# Patient Record
Sex: Female | Born: 1970
Health system: Southern US, Community
[De-identification: ages and names within clinical notes are randomized; demographics above are authoritative.]

## PROBLEM LIST (undated history)

## (undated) DIAGNOSIS — E7800 Pure hypercholesterolemia, unspecified: Secondary | ICD-10-CM

## (undated) DIAGNOSIS — Z8619 Personal history of other infectious and parasitic diseases: Secondary | ICD-10-CM

## (undated) DIAGNOSIS — O09529 Supervision of elderly multigravida, unspecified trimester: Secondary | ICD-10-CM

## (undated) DIAGNOSIS — I10 Essential (primary) hypertension: Secondary | ICD-10-CM

## (undated) HISTORY — DX: Personal history of other infectious and parasitic diseases: Z86.19

## (undated) HISTORY — DX: Supervision of elderly multigravida, unspecified trimester: O09.529

## (undated) HISTORY — DX: Pure hypercholesterolemia, unspecified: E78.00

## (undated) HISTORY — DX: Essential (primary) hypertension: I10

---

## 1997-01-30 HISTORY — PX: REDUCTION MAMMAPLASTY: SUR839

## 1997-01-30 HISTORY — PX: BREAST SURGERY: SHX581

## 1998-01-14 ENCOUNTER — Other Ambulatory Visit: Admission: RE | Admit: 1998-01-14 | Discharge: 1998-01-14 | Payer: Self-pay | Admitting: Obstetrics

## 1998-01-14 ENCOUNTER — Ambulatory Visit (HOSPITAL_COMMUNITY): Admission: RE | Admit: 1998-01-14 | Discharge: 1998-01-14 | Payer: Self-pay | Admitting: Obstetrics

## 1998-07-01 ENCOUNTER — Inpatient Hospital Stay: Admission: AD | Admit: 1998-07-01 | Discharge: 1998-07-01 | Payer: Self-pay | Admitting: Obstetrics

## 1998-08-26 ENCOUNTER — Inpatient Hospital Stay (HOSPITAL_COMMUNITY): Admission: AD | Admit: 1998-08-26 | Discharge: 1998-08-28 | Payer: Self-pay | Admitting: Obstetrics

## 1999-01-05 ENCOUNTER — Other Ambulatory Visit: Admission: RE | Admit: 1999-01-05 | Discharge: 1999-01-05 | Payer: Self-pay | Admitting: Obstetrics

## 2001-11-08 ENCOUNTER — Other Ambulatory Visit: Admission: RE | Admit: 2001-11-08 | Discharge: 2001-11-08 | Payer: Self-pay | Admitting: Family Medicine

## 2003-01-12 ENCOUNTER — Other Ambulatory Visit: Admission: RE | Admit: 2003-01-12 | Discharge: 2003-01-12 | Payer: Self-pay | Admitting: Family Medicine

## 2004-01-19 ENCOUNTER — Other Ambulatory Visit: Admission: RE | Admit: 2004-01-19 | Discharge: 2004-01-19 | Payer: Self-pay | Admitting: Family Medicine

## 2005-01-19 ENCOUNTER — Other Ambulatory Visit: Admission: RE | Admit: 2005-01-19 | Discharge: 2005-01-19 | Payer: Self-pay | Admitting: Obstetrics and Gynecology

## 2006-01-22 ENCOUNTER — Other Ambulatory Visit: Admission: RE | Admit: 2006-01-22 | Discharge: 2006-01-22 | Payer: Self-pay | Admitting: Family Medicine

## 2006-03-15 ENCOUNTER — Ambulatory Visit (HOSPITAL_BASED_OUTPATIENT_CLINIC_OR_DEPARTMENT_OTHER): Admission: RE | Admit: 2006-03-15 | Discharge: 2006-03-15 | Payer: Self-pay | Admitting: Urology

## 2006-03-19 ENCOUNTER — Ambulatory Visit (HOSPITAL_COMMUNITY): Admission: RE | Admit: 2006-03-19 | Discharge: 2006-03-19 | Payer: Self-pay | Admitting: Urology

## 2007-03-05 ENCOUNTER — Other Ambulatory Visit: Admission: RE | Admit: 2007-03-05 | Discharge: 2007-03-05 | Payer: Self-pay | Admitting: Family Medicine

## 2008-05-18 ENCOUNTER — Other Ambulatory Visit: Admission: RE | Admit: 2008-05-18 | Discharge: 2008-05-18 | Payer: Self-pay | Admitting: Family Medicine

## 2009-07-05 ENCOUNTER — Other Ambulatory Visit: Admission: RE | Admit: 2009-07-05 | Discharge: 2009-07-05 | Payer: Self-pay | Admitting: Family Medicine

## 2010-02-20 ENCOUNTER — Encounter: Payer: Self-pay | Admitting: Family Medicine

## 2010-06-17 NOTE — Op Note (Signed)
NAME:  Penny Ramirez, Penny Ramirez NO.:  [id number]   MEDICAL RECORD NO.:  [id number]          PATIENT TYPE:  AMB   LOCATION:  [facility]                         FACILITY:  [facility]   PHYSICIAN:  Excell Seltzer. Annabell Howells, M.D.    DATE OF BIRTH:  [birth year]   DATE OF PROCEDURE:  03/15/2006  DATE OF DISCHARGE:                               OPERATIVE REPORT   PROCEDURE:  Cystoscopy, left retrograde pyelogram with interpretation,  insertion of left double-J stent.   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   SURGEON:  Dr. Bjorn Pippin.   ANESTHESIA:  General.   DRAINS:  6-French x 24 cm double-J stent.   COMPLICATIONS:  None.   INDICATIONS:  Ms. Lizaola is a 40 year old African American female who  presented on January 29 with history of recurrent voiding difficulties.  She was found to have urinary tract infection but also obstructed left  kidney with stones.  A follow-up CT scan demonstrated a small left UPJ  stone with multiple lower pole and mid renal stones.  It was felt that  cysto and stenting was indicated followed by lithotripsy at a later  date.  She is currently on Macrodantin for suppression.   FINDINGS AND PROCEDURE:  The patient is taken operating room where  general anesthetic was induced.  She had received Cipro preoperatively.  She was placed in lithotomy position.  Her perineum and genitalia were  prepped Betadine solution.  She was draped in the usual sterile fashion.  The preoperative KUB revealed a calcification in the area of the left  UPJ with additional stones in the left mid and lower poles   In the operating room the patient was prepped with Betadine solution and  draped in the usual sterile fashions.  Cysto was performed in usual  fashion with the 16-French  flexible scope with a 12 and 70 degrees  lenses.  Examination revealed a normal urethra.  The bladder wall had  mild trabeculation.  No tumor, stones or inflammation were noted.  Ureteral orifices were unremarkable.   The left ureteral orifice was cannulated with a 5-French open end  catheter and contrast was instilled in retrograde fashion.   Retrograde pyelogram revealed a normal ureter up to the level of the UPJ  where there was some narrowing but I would not consider it a UPJ  obstruction.  I think the stone actually retropulsed to the kidney with  the contrast.  There was moderate hydro caliectasis with filling defects  in the mid lower pole consistent with a stone seen on KUB.   After completion of retrograde pyelogram a guidewire was passed to the  kidney and a 6-French 24 cm double-J stent was passed to the kidney  without difficulty under fluoroscopic guidance.  The wire was removed  leaving good coil in the kidney, good coil in the bladder.  The bladder  was drained.  The cystoscope was removed.  The patient was taken down  from lithotomy position.  Her anesthetic was reversed.  She was moved to  the recovery room in stable condition.  There were no complications.  She will have lithotripsy done sometime next week.      Excell Seltzer. Annabell Howells, M.D.  Electronically Signed     JJW/MEDQ  D:  03/15/2006  T:  03/15/2006  Job:  [id number]

## 2010-09-06 ENCOUNTER — Other Ambulatory Visit (HOSPITAL_COMMUNITY)
Admission: RE | Admit: 2010-09-06 | Discharge: 2010-09-06 | Disposition: A | Discharge status: Home / Self Care | Payer: 59 | Source: Ambulatory Visit | Attending: Family Medicine | Admitting: Family Medicine

## 2010-09-06 ENCOUNTER — Other Ambulatory Visit: Payer: Self-pay | Admitting: Family Medicine

## 2010-09-06 DIAGNOSIS — Z124 Encounter for screening for malignant neoplasm of cervix: Secondary | ICD-10-CM | POA: Insufficient documentation

## 2011-05-12 LAB — OB RESULTS CONSOLE ANTIBODY SCREEN: Antibody Screen: NEGATIVE

## 2011-05-12 LAB — OB RESULTS CONSOLE HEPATITIS B SURFACE ANTIGEN: Hepatitis B Surface Ag: NEGATIVE

## 2011-05-12 LAB — OB RESULTS CONSOLE ABO/RH

## 2011-05-26 ENCOUNTER — Other Ambulatory Visit (HOSPITAL_COMMUNITY): Payer: Self-pay | Admitting: Obstetrics and Gynecology

## 2011-05-26 ENCOUNTER — Ambulatory Visit (HOSPITAL_COMMUNITY)
Admission: RE | Admit: 2011-05-26 | Discharge: 2011-05-26 | Disposition: A | Discharge status: Home / Self Care | Payer: 59 | Source: Ambulatory Visit | Attending: Obstetrics and Gynecology | Admitting: Obstetrics and Gynecology

## 2011-05-26 DIAGNOSIS — O3680X0 Pregnancy with inconclusive fetal viability, not applicable or unspecified: Secondary | ICD-10-CM | POA: Insufficient documentation

## 2011-05-26 DIAGNOSIS — Z3689 Encounter for other specified antenatal screening: Secondary | ICD-10-CM | POA: Insufficient documentation

## 2011-05-26 LAB — OB RESULTS CONSOLE GC/CHLAMYDIA: Gonorrhea: NEGATIVE

## 2011-11-02 ENCOUNTER — Other Ambulatory Visit (HOSPITAL_COMMUNITY): Payer: Self-pay | Admitting: Obstetrics and Gynecology

## 2011-11-02 ENCOUNTER — Encounter (HOSPITAL_COMMUNITY): Payer: Self-pay

## 2011-11-02 ENCOUNTER — Ambulatory Visit (HOSPITAL_COMMUNITY)
Admission: RE | Admit: 2011-11-02 | Discharge: 2011-11-02 | Disposition: A | Discharge status: Home / Self Care | Payer: 59 | Source: Ambulatory Visit | Attending: Obstetrics and Gynecology | Admitting: Obstetrics and Gynecology

## 2011-11-02 DIAGNOSIS — Z3689 Encounter for other specified antenatal screening: Secondary | ICD-10-CM

## 2011-11-02 DIAGNOSIS — O368390 Maternal care for abnormalities of the fetal heart rate or rhythm, unspecified trimester, not applicable or unspecified: Secondary | ICD-10-CM | POA: Insufficient documentation

## 2011-11-02 DIAGNOSIS — O10019 Pre-existing essential hypertension complicating pregnancy, unspecified trimester: Secondary | ICD-10-CM | POA: Insufficient documentation

## 2011-11-28 ENCOUNTER — Other Ambulatory Visit (HOSPITAL_COMMUNITY): Payer: Self-pay | Admitting: Obstetrics and Gynecology

## 2011-11-28 DIAGNOSIS — I10 Essential (primary) hypertension: Secondary | ICD-10-CM

## 2011-12-05 ENCOUNTER — Ambulatory Visit (HOSPITAL_COMMUNITY)
Admission: RE | Admit: 2011-12-05 | Discharge: 2011-12-05 | Disposition: A | Discharge status: Home / Self Care | Payer: 59 | Source: Ambulatory Visit | Attending: Obstetrics and Gynecology | Admitting: Obstetrics and Gynecology

## 2011-12-05 DIAGNOSIS — O10019 Pre-existing essential hypertension complicating pregnancy, unspecified trimester: Secondary | ICD-10-CM | POA: Insufficient documentation

## 2011-12-05 DIAGNOSIS — I10 Essential (primary) hypertension: Secondary | ICD-10-CM

## 2011-12-21 ENCOUNTER — Encounter (HOSPITAL_COMMUNITY): Payer: Self-pay | Admitting: *Deleted

## 2011-12-21 ENCOUNTER — Telehealth (HOSPITAL_COMMUNITY): Payer: Self-pay | Admitting: *Deleted

## 2011-12-21 NOTE — Telephone Encounter (Signed)
Preadmission screen  

## 2011-12-26 ENCOUNTER — Encounter (HOSPITAL_COMMUNITY): Payer: Self-pay

## 2011-12-26 ENCOUNTER — Other Ambulatory Visit: Payer: Self-pay | Admitting: Obstetrics and Gynecology

## 2011-12-26 ENCOUNTER — Inpatient Hospital Stay (HOSPITAL_COMMUNITY)
Admission: RE | Admit: 2011-12-26 | Discharge: 2011-12-29 | DRG: 765 | Disposition: A | Discharge status: Home / Self Care | Payer: 59 | Source: Ambulatory Visit | Attending: Obstetrics and Gynecology | Admitting: Obstetrics and Gynecology

## 2011-12-26 VITALS — BP 131/85 | HR 84 | Temp 98.0°F | Resp 16 | Ht 64.5 in | Wt 240.0 lb

## 2011-12-26 DIAGNOSIS — O1002 Pre-existing essential hypertension complicating childbirth: Secondary | ICD-10-CM | POA: Diagnosis present

## 2011-12-26 DIAGNOSIS — Z98891 History of uterine scar from previous surgery: Secondary | ICD-10-CM

## 2011-12-26 DIAGNOSIS — Z22330 Carrier of Group B streptococcus: Secondary | ICD-10-CM

## 2011-12-26 DIAGNOSIS — O99892 Other specified diseases and conditions complicating childbirth: Secondary | ICD-10-CM | POA: Diagnosis present

## 2011-12-26 DIAGNOSIS — O09529 Supervision of elderly multigravida, unspecified trimester: Secondary | ICD-10-CM | POA: Diagnosis present

## 2011-12-26 LAB — CBC
Hemoglobin: 12 g/dL (ref 12.0–15.0)
MCH: 29.9 pg (ref 26.0–34.0)
RBC: 4.01 MIL/uL (ref 3.87–5.11)
WBC: 9.1 10*3/uL (ref 4.0–10.5)

## 2011-12-26 LAB — TYPE AND SCREEN: Antibody Screen: NEGATIVE

## 2011-12-26 LAB — ABO/RH: ABO/RH(D): A POS

## 2011-12-26 LAB — RPR: RPR Ser Ql: NONREACTIVE

## 2011-12-26 MED ORDER — LACTATED RINGERS IV SOLN
500.0000 mL | INTRAVENOUS | Status: DC | PRN
  Administered 2011-12-26: 1000 mL via INTRAVENOUS
  Administered 2011-12-26 – 2011-12-27 (×2): 500 mL via INTRAVENOUS

## 2011-12-26 MED ORDER — FLEET ENEMA 7-19 GM/118ML RE ENEM: 1.0000 | ENEMA | RECTAL | Status: DC | PRN

## 2011-12-26 MED ORDER — CITRIC ACID-SODIUM CITRATE 334-500 MG/5ML PO SOLN
30.0000 mL | ORAL | Status: DC | PRN
  Administered 2011-12-27: 30 mL via ORAL
  Filled 2011-12-26: qty 15

## 2011-12-26 MED ORDER — TERBUTALINE SULFATE 1 MG/ML IJ SOLN: 0.2500 mg | Freq: Once | INTRAMUSCULAR | Status: AC | PRN

## 2011-12-26 MED ORDER — OXYCODONE-ACETAMINOPHEN 5-325 MG PO TABS: 1.0000 | ORAL_TABLET | ORAL | Status: DC | PRN

## 2011-12-26 MED ORDER — OXYTOCIN 40 UNITS IN LACTATED RINGERS INFUSION - SIMPLE MED: 62.5000 mL/h | INTRAVENOUS | Status: DC

## 2011-12-26 MED ORDER — LACTATED RINGERS IV SOLN
INTRAVENOUS | Status: DC
  Administered 2011-12-26 – 2011-12-27 (×3): via INTRAVENOUS

## 2011-12-26 MED ORDER — IBUPROFEN 600 MG PO TABS: 600.0000 mg | ORAL_TABLET | Freq: Four times a day (QID) | ORAL | Status: DC | PRN

## 2011-12-26 MED ORDER — ONDANSETRON HCL 4 MG/2ML IJ SOLN: 4.0000 mg | Freq: Four times a day (QID) | INTRAMUSCULAR | Status: DC | PRN

## 2011-12-26 MED ORDER — ACETAMINOPHEN 325 MG PO TABS: 650.0000 mg | ORAL_TABLET | ORAL | Status: DC | PRN

## 2011-12-26 MED ORDER — OXYTOCIN 40 UNITS IN LACTATED RINGERS INFUSION - SIMPLE MED
1.0000 m[IU]/min | INTRAVENOUS | Status: DC
  Administered 2011-12-26: 1 m[IU]/min via INTRAVENOUS
  Administered 2011-12-27: 5 m[IU]/min via INTRAVENOUS
  Filled 2011-12-26: qty 1000

## 2011-12-26 MED ORDER — MISOPROSTOL 25 MCG QUARTER TABLET
25.0000 ug | ORAL_TABLET | ORAL | Status: DC | PRN
  Administered 2011-12-26: 25 ug via VAGINAL
  Filled 2011-12-26: qty 0.25

## 2011-12-26 MED ORDER — PENICILLIN G POTASSIUM 5000000 UNITS IJ SOLR
2.5000 10*6.[IU] | INTRAMUSCULAR | Status: DC
  Filled 2011-12-26 (×7): qty 2.5

## 2011-12-26 MED ORDER — LIDOCAINE HCL (PF) 1 % IJ SOLN: 30.0000 mL | INTRAMUSCULAR | Status: DC | PRN

## 2011-12-26 MED ORDER — OXYTOCIN BOLUS FROM INFUSION: 500.0000 mL | INTRAVENOUS | Status: DC

## 2011-12-26 MED ORDER — PENICILLIN G POTASSIUM 5000000 UNITS IJ SOLR
5.0000 10*6.[IU] | Freq: Once | INTRAVENOUS | Status: DC
  Filled 2011-12-26: qty 5

## 2011-12-26 MED ORDER — LABETALOL HCL 100 MG PO TABS
100.0000 mg | ORAL_TABLET | Freq: Two times a day (BID) | ORAL | Status: DC
  Administered 2011-12-26: 100 mg via ORAL
  Filled 2011-12-26 (×2): qty 1

## 2011-12-26 NOTE — Progress Notes (Signed)
Penny Ramirez is a 41 y.o. G3P2002 at [redacted]w[redacted]d by LMP admitted for induction of labor due to Post dates. Due date 12/26/2011 and chronic hypertension Pt request social induction .  Subjective:   Objective: BP 115/72  Pulse 80  Temp 98.4 F (36.9 C) (Oral)  Resp 20  Ht 5' 4.5" (1.638 m)  Wt 108.863 kg (240 lb)  BMI 40.56 kg/m2  LMP 03/21/2011      FHT:  FHR baseline 150 with moderate variability + accelerations .[name] Decelerations have resolved  UC:   irregular, every 3-5 minutes SVE:   Dilation: Closed Station: -3 Exam by:: Dr. Richardson Dopp  Labs: Lab Results  Component Value Date   WBC 9.1 12/26/2011   HGB 12.0 12/26/2011   HCT 35.6* 12/26/2011   MCV 88.8 12/26/2011   PLT 226 12/26/2011    Assessment / Plan: 40 wks 0 days for induction  Due to chronic hypertension ... And social request   Labor: cytotec in place  Preeclampsia:  no signs or symptoms of toxicity Fetal Wellbeing:  Category I Pain Control:  Labor support without medications I/D:  planning penicillin in active labor or with ROM for + Gbs status    Melani Brisbane J. 12/26/2011, 2:36 PM

## 2011-12-26 NOTE — H&P (Signed)
Penny Ramirez is a 41 y.o. female presenting for  Induction at 40 wks 0 days. Based on LMP confirmed by u/s with EDD 12/26/2011. Pregnancy complicated by chornic hypertension. Pt denies contractions no lof no vaginal bleeding + FM  History OB History    Grav Para Term Preterm Abortions TAB SAB Ect Mult Living   3 2 2  0 0 0 0 0 0 2     Past Medical History  Diagnosis Date  . Hx of varicella   . AMA (advanced maternal age) multigravida 35+   . Hypertension     labetolol   Past Surgical History  Procedure Date  . Breast surgery 1999    reduction   Family History: family history includes Diabetes in her father and paternal uncle; Heart disease in her father; and Hypertension in her father. Social History:  reports that she has never smoked. She has never used smokeless tobacco. She reports that she does not drink alcohol or use illicit drugs.   Prenatal Transfer Tool  Maternal Diabetes: No Genetic Screening: Normal Maternal Ultrasounds/Referrals: Normal Fetal Ultrasounds or other Referrals:  None Maternal Substance Abuse:  No Significant Maternal Medications:  Meds include: Other:  Significant Maternal Lab Results:  None GBS positive  Other Comments:  None  ROS  Dilation: Closed Station: -3 Exam by:: Dr. Richardson Dopp Blood pressure 115/72, pulse 80, temperature 98.4 F (36.9 C), temperature source Oral, resp. rate 20, height 5' 4.5" (1.638 m), weight 108.863 kg (240 lb), last menstrual period 03/21/2011. Maternal Exam:  Abdomen: Fetal presentation: vertex  Introitus: Normal vulva. Normal vagina.  Pelvis: adequate for delivery.   Cervix: Cervix evaluated by digital exam.     Fetal Exam Fetal Monitor Review: Mode: fetoscope.   Baseline rate: 150.  Variability: moderate (6-25 bpm).   Pattern: variable decelerations and late decelerations.    Fetal State Assessment: Category II - tracings are indeterminate.     Physical Exam  Constitutional: She is oriented to person,  place, and time. She appears well-developed and well-nourished.  Cardiovascular: Normal rate.   GI: There is no tenderness.  Musculoskeletal: Normal range of motion.  Neurological: She is alert and oriented to person, place, and time.  Skin: Skin is warm and dry.  Psychiatric: She has a normal mood and affect.    Prenatal labs: ABO, Rh: A/Positive/-- (04/12 0000) Antibody: Negative (04/12 0000) Rubella: Immune (04/12 0000) RPR: Nonreactive (04/12 0000)  HBsAg: Negative (04/12 0000)  HIV: Non-reactive (04/12 0000)  GBS: Positive (10/29 0000)   Assessment/Plan: 40 wks and 0 days with chronic hypertension for induction. Pt was advised of increased risk of cesarean section associated with induction. She voiced understanding and desires to have induction scheduled.   pt with variable and late decels currently ... If they do not resolve with repositioning and fluid bolus suggest cesarean section. R/b/a of cesarean section were discussed with the patient including but not limited to infection bleeding damage to bowel bladder baby with the need for further surgery. R/o transfusion HIV/ hep B&C discussed pt voiced understanding  Loui Massenburg J. 12/26/2011, 2:22 PM

## 2011-12-26 NOTE — Progress Notes (Signed)
Called Dr. Richardson Dopp to notify of 2 late decelerations and min-mod variability and accels with recovery without interventions. MD aware. Orders to leave Pitocin at 5mu and call if decels become repetative.

## 2011-12-26 NOTE — Progress Notes (Signed)
Spoke with Dr. Richardson Dopp, orders received for labetalol 100mg  PO BID first dose tonight.  Reported FHR min-mod variability with occasional Accels. Recent BP's reported to MD. No new orders received.

## 2011-12-27 ENCOUNTER — Encounter (HOSPITAL_COMMUNITY): Payer: Self-pay

## 2011-12-27 ENCOUNTER — Encounter (HOSPITAL_COMMUNITY): Payer: Self-pay | Admitting: Anesthesiology

## 2011-12-27 ENCOUNTER — Observation Stay (HOSPITAL_COMMUNITY): Payer: 59 | Admitting: Anesthesiology

## 2011-12-27 ENCOUNTER — Encounter (HOSPITAL_COMMUNITY)
Admission: RE | Disposition: A | Discharge status: Home / Self Care | Payer: Self-pay | Source: Ambulatory Visit | Attending: Obstetrics and Gynecology

## 2011-12-27 LAB — CBC
MCV: 89.3 fL (ref 78.0–100.0)
Platelets: 213 10*3/uL (ref 150–400)
RDW: 14.3 % (ref 11.5–15.5)
WBC: 11.3 10*3/uL — ABNORMAL HIGH (ref 4.0–10.5)

## 2011-12-27 SURGERY — Surgical Case
Anesthesia: Epidural | Site: Abdomen | Wound class: Clean Contaminated

## 2011-12-27 MED ORDER — ZOLPIDEM TARTRATE 5 MG PO TABS: 5.0000 mg | ORAL_TABLET | Freq: Every evening | ORAL | Status: DC | PRN

## 2011-12-27 MED ORDER — SCOPOLAMINE 1 MG/3DAYS TD PT72
1.0000 | MEDICATED_PATCH | Freq: Once | TRANSDERMAL | Status: DC
  Administered 2011-12-27: 1.5 mg via TRANSDERMAL

## 2011-12-27 MED ORDER — LACTATED RINGERS IV SOLN
INTRAVENOUS | Status: DC
  Administered 2011-12-27: 16:00:00 via INTRAVENOUS

## 2011-12-27 MED ORDER — FERROUS SULFATE 325 (65 FE) MG PO TABS: 325.0000 mg | ORAL_TABLET | Freq: Two times a day (BID) | ORAL | Status: DC

## 2011-12-27 MED ORDER — ONDANSETRON HCL 4 MG/2ML IJ SOLN: 4.0000 mg | Freq: Three times a day (TID) | INTRAMUSCULAR | Status: DC | PRN

## 2011-12-27 MED ORDER — METOCLOPRAMIDE HCL 5 MG/ML IJ SOLN: 10.0000 mg | Freq: Three times a day (TID) | INTRAMUSCULAR | Status: DC | PRN

## 2011-12-27 MED ORDER — FENTANYL CITRATE 0.05 MG/ML IJ SOLN: 25.0000 ug | INTRAMUSCULAR | Status: DC | PRN

## 2011-12-27 MED ORDER — SCOPOLAMINE 1 MG/3DAYS TD PT72
MEDICATED_PATCH | TRANSDERMAL | Status: AC
  Filled 2011-12-27: qty 1

## 2011-12-27 MED ORDER — OXYTOCIN 40 UNITS IN LACTATED RINGERS INFUSION - SIMPLE MED
INTRAVENOUS | Status: DC | PRN
  Administered 2011-12-27: 40 [IU] via INTRAVENOUS

## 2011-12-27 MED ORDER — DEXTROSE 5 % IV SOLN
1.0000 ug/kg/h | INTRAVENOUS | Status: DC | PRN
  Filled 2011-12-27: qty 2

## 2011-12-27 MED ORDER — LACTATED RINGERS IV SOLN
INTRAVENOUS | Status: DC | PRN
  Administered 2011-12-27 (×3): via INTRAVENOUS

## 2011-12-27 MED ORDER — OXYCODONE-ACETAMINOPHEN 5-325 MG PO TABS: 1.0000 | ORAL_TABLET | ORAL | Status: DC | PRN

## 2011-12-27 MED ORDER — SIMETHICONE 80 MG PO CHEW
80.0000 mg | CHEWABLE_TABLET | Freq: Three times a day (TID) | ORAL | Status: DC
  Administered 2011-12-27 – 2011-12-29 (×6): 80 mg via ORAL

## 2011-12-27 MED ORDER — DIPHENHYDRAMINE HCL 50 MG/ML IJ SOLN
12.5000 mg | INTRAMUSCULAR | Status: DC | PRN
  Administered 2011-12-27: 12.5 mg via INTRAVENOUS
  Filled 2011-12-27: qty 1

## 2011-12-27 MED ORDER — IBUPROFEN 600 MG PO TABS: 600.0000 mg | ORAL_TABLET | Freq: Four times a day (QID) | ORAL | Status: DC | PRN

## 2011-12-27 MED ORDER — IBUPROFEN 600 MG PO TABS
600.0000 mg | ORAL_TABLET | Freq: Four times a day (QID) | ORAL | Status: DC
  Administered 2011-12-27 – 2011-12-29 (×7): 600 mg via ORAL
  Filled 2011-12-27 (×7): qty 1

## 2011-12-27 MED ORDER — PRENATAL MULTIVITAMIN CH
1.0000 | ORAL_TABLET | Freq: Every day | ORAL | Status: DC
  Administered 2011-12-28 – 2011-12-29 (×2): 1 via ORAL
  Filled 2011-12-27: qty 1

## 2011-12-27 MED ORDER — ONDANSETRON HCL 4 MG/2ML IJ SOLN: 4.0000 mg | INTRAMUSCULAR | Status: DC | PRN

## 2011-12-27 MED ORDER — METOCLOPRAMIDE HCL 5 MG/ML IJ SOLN: 10.0000 mg | Freq: Once | INTRAMUSCULAR | Status: DC | PRN

## 2011-12-27 MED ORDER — LACTATED RINGERS IV SOLN
INTRAVENOUS | Status: DC | PRN
  Administered 2011-12-27: 08:00:00 via INTRAVENOUS

## 2011-12-27 MED ORDER — FENTANYL CITRATE 0.05 MG/ML IJ SOLN
INTRAMUSCULAR | Status: DC | PRN
  Administered 2011-12-27: 25 ug via INTRATHECAL

## 2011-12-27 MED ORDER — EPHEDRINE 5 MG/ML INJ
10.0000 mg | INTRAVENOUS | Status: DC | PRN
  Filled 2011-12-27: qty 4

## 2011-12-27 MED ORDER — FENTANYL 2.5 MCG/ML BUPIVACAINE 1/10 % EPIDURAL INFUSION (WH - ANES)
14.0000 mL/h | INTRAMUSCULAR | Status: DC
  Filled 2011-12-27: qty 125

## 2011-12-27 MED ORDER — PHENYLEPHRINE 40 MCG/ML (10ML) SYRINGE FOR IV PUSH (FOR BLOOD PRESSURE SUPPORT): 80.0000 ug | PREFILLED_SYRINGE | INTRAVENOUS | Status: DC | PRN

## 2011-12-27 MED ORDER — PRENATAL MULTIVITAMIN CH: 1.0000 | ORAL_TABLET | Freq: Every day | ORAL | Status: DC

## 2011-12-27 MED ORDER — KETOROLAC TROMETHAMINE 30 MG/ML IJ SOLN
INTRAMUSCULAR | Status: AC
  Filled 2011-12-27: qty 1

## 2011-12-27 MED ORDER — CEFAZOLIN SODIUM-DEXTROSE 2-3 GM-% IV SOLR
INTRAVENOUS | Status: AC
  Filled 2011-12-27: qty 50

## 2011-12-27 MED ORDER — KETOROLAC TROMETHAMINE 30 MG/ML IJ SOLN: 30.0000 mg | Freq: Four times a day (QID) | INTRAMUSCULAR | Status: AC | PRN

## 2011-12-27 MED ORDER — MORPHINE SULFATE (PF) 0.5 MG/ML IJ SOLN
INTRAMUSCULAR | Status: DC | PRN
  Administered 2011-12-27: .15 mg via INTRATHECAL

## 2011-12-27 MED ORDER — NALBUPHINE SYRINGE 5 MG/0.5 ML
5.0000 mg | INJECTION | INTRAMUSCULAR | Status: DC | PRN
  Filled 2011-12-27: qty 1

## 2011-12-27 MED ORDER — DIPHENHYDRAMINE HCL 25 MG PO CAPS: 25.0000 mg | ORAL_CAPSULE | Freq: Four times a day (QID) | ORAL | Status: DC | PRN

## 2011-12-27 MED ORDER — CEFAZOLIN SODIUM-DEXTROSE 2-3 GM-% IV SOLR
2.0000 g | Freq: Once | INTRAVENOUS | Status: AC
  Administered 2011-12-27: 2 g via INTRAVENOUS
  Filled 2011-12-27: qty 50

## 2011-12-27 MED ORDER — METHYLERGONOVINE MALEATE 0.2 MG PO TABS: 0.2000 mg | ORAL_TABLET | ORAL | Status: DC | PRN

## 2011-12-27 MED ORDER — MEPERIDINE HCL 25 MG/ML IJ SOLN: 6.2500 mg | INTRAMUSCULAR | Status: DC | PRN

## 2011-12-27 MED ORDER — PENICILLIN G POTASSIUM 5000000 UNITS IJ SOLR
5.0000 10*6.[IU] | Freq: Once | INTRAVENOUS | Status: DC
  Filled 2011-12-27: qty 5

## 2011-12-27 MED ORDER — SIMETHICONE 80 MG PO CHEW: 80.0000 mg | CHEWABLE_TABLET | ORAL | Status: DC | PRN

## 2011-12-27 MED ORDER — SODIUM CHLORIDE 0.9 % IJ SOLN: 3.0000 mL | INTRAMUSCULAR | Status: DC | PRN

## 2011-12-27 MED ORDER — DIBUCAINE 1 % RE OINT: 1.0000 "application " | TOPICAL_OINTMENT | RECTAL | Status: DC | PRN

## 2011-12-27 MED ORDER — METHYLERGONOVINE MALEATE 0.2 MG/ML IJ SOLN: 0.2000 mg | INTRAMUSCULAR | Status: DC | PRN

## 2011-12-27 MED ORDER — NALOXONE HCL 0.4 MG/ML IJ SOLN: 0.4000 mg | INTRAMUSCULAR | Status: DC | PRN

## 2011-12-27 MED ORDER — KETOROLAC TROMETHAMINE 60 MG/2ML IM SOLN
60.0000 mg | Freq: Once | INTRAMUSCULAR | Status: AC | PRN
  Filled 2011-12-27: qty 2

## 2011-12-27 MED ORDER — SCOPOLAMINE 1 MG/3DAYS TD PT72: 1.0000 | MEDICATED_PATCH | TRANSDERMAL | Status: DC

## 2011-12-27 MED ORDER — ONDANSETRON HCL 4 MG PO TABS: 4.0000 mg | ORAL_TABLET | ORAL | Status: DC | PRN

## 2011-12-27 MED ORDER — PHENYLEPHRINE HCL 10 MG/ML IJ SOLN
INTRAMUSCULAR | Status: DC | PRN
  Administered 2011-12-27: 80 ug via INTRAVENOUS
  Administered 2011-12-27: 40 ug via INTRAVENOUS
  Administered 2011-12-27: 80 ug via INTRAVENOUS
  Administered 2011-12-27: 40 ug via INTRAVENOUS

## 2011-12-27 MED ORDER — ONDANSETRON HCL 4 MG/2ML IJ SOLN
INTRAMUSCULAR | Status: DC | PRN
  Administered 2011-12-27: 4 mg via INTRAVENOUS

## 2011-12-27 MED ORDER — NALBUPHINE HCL 10 MG/ML IJ SOLN
5.0000 mg | INTRAMUSCULAR | Status: DC | PRN
  Filled 2011-12-27: qty 1

## 2011-12-27 MED ORDER — FERROUS SULFATE 325 (65 FE) MG PO TABS
325.0000 mg | ORAL_TABLET | Freq: Two times a day (BID) | ORAL | Status: DC
  Administered 2011-12-28 – 2011-12-29 (×3): 325 mg via ORAL
  Filled 2011-12-27 (×4): qty 1

## 2011-12-27 MED ORDER — DIPHENHYDRAMINE HCL 50 MG/ML IJ SOLN: 25.0000 mg | INTRAMUSCULAR | Status: DC | PRN

## 2011-12-27 MED ORDER — SENNOSIDES-DOCUSATE SODIUM 8.6-50 MG PO TABS: 2.0000 | ORAL_TABLET | Freq: Every day | ORAL | Status: DC

## 2011-12-27 MED ORDER — SCOPOLAMINE 1 MG/3DAYS TD PT72
1.0000 | MEDICATED_PATCH | Freq: Once | TRANSDERMAL | Status: DC
  Filled 2011-12-27: qty 1

## 2011-12-27 MED ORDER — PHENYLEPHRINE 40 MCG/ML (10ML) SYRINGE FOR IV PUSH (FOR BLOOD PRESSURE SUPPORT)
PREFILLED_SYRINGE | INTRAVENOUS | Status: AC
  Filled 2011-12-27: qty 5

## 2011-12-27 MED ORDER — DIPHENHYDRAMINE HCL 50 MG/ML IJ SOLN: 12.5000 mg | INTRAMUSCULAR | Status: DC | PRN

## 2011-12-27 MED ORDER — DIPHENHYDRAMINE HCL 25 MG PO CAPS: 25.0000 mg | ORAL_CAPSULE | ORAL | Status: DC | PRN

## 2011-12-27 MED ORDER — LACTATED RINGERS IV SOLN: 500.0000 mL | Freq: Once | INTRAVENOUS | Status: DC

## 2011-12-27 MED ORDER — PENICILLIN G POTASSIUM 5000000 UNITS IJ SOLR
2.5000 10*6.[IU] | INTRAVENOUS | Status: DC
  Filled 2011-12-27 (×2): qty 2.5

## 2011-12-27 MED ORDER — OXYTOCIN 40 UNITS IN LACTATED RINGERS INFUSION - SIMPLE MED: 62.5000 mL/h | INTRAVENOUS | Status: DC

## 2011-12-27 MED ORDER — KETOROLAC TROMETHAMINE 30 MG/ML IJ SOLN: 30.0000 mg | Freq: Four times a day (QID) | INTRAMUSCULAR | Status: DC | PRN

## 2011-12-27 MED ORDER — SIMETHICONE 80 MG PO CHEW: 80.0000 mg | CHEWABLE_TABLET | Freq: Three times a day (TID) | ORAL | Status: DC

## 2011-12-27 MED ORDER — MORPHINE SULFATE 0.5 MG/ML IJ SOLN
INTRAMUSCULAR | Status: AC
  Filled 2011-12-27: qty 10

## 2011-12-27 MED ORDER — IBUPROFEN 600 MG PO TABS: 600.0000 mg | ORAL_TABLET | Freq: Four times a day (QID) | ORAL | Status: DC

## 2011-12-27 MED ORDER — MENTHOL 3 MG MT LOZG: 1.0000 | LOZENGE | OROMUCOSAL | Status: DC | PRN

## 2011-12-27 MED ORDER — LABETALOL HCL 100 MG PO TABS
100.0000 mg | ORAL_TABLET | Freq: Two times a day (BID) | ORAL | Status: DC
Start: 2011-12-27 — End: 2011-12-27
  Filled 2011-12-27 (×3): qty 1

## 2011-12-27 MED ORDER — EPHEDRINE SULFATE 50 MG/ML IJ SOLN
INTRAMUSCULAR | Status: DC | PRN
  Administered 2011-12-27: 5 mg via INTRAVENOUS
  Administered 2011-12-27: 10 mg via INTRAVENOUS

## 2011-12-27 MED ORDER — DIPHENHYDRAMINE HCL 25 MG PO CAPS
25.0000 mg | ORAL_CAPSULE | ORAL | Status: DC | PRN
  Filled 2011-12-27: qty 1

## 2011-12-27 MED ORDER — ZOLPIDEM TARTRATE 5 MG PO TABS
5.0000 mg | ORAL_TABLET | Freq: Once | ORAL | Status: AC
  Administered 2011-12-27: 5 mg via ORAL
  Filled 2011-12-27: qty 1

## 2011-12-27 MED ORDER — PHENYLEPHRINE 40 MCG/ML (10ML) SYRINGE FOR IV PUSH (FOR BLOOD PRESSURE SUPPORT)
80.0000 ug | PREFILLED_SYRINGE | INTRAVENOUS | Status: DC | PRN
  Filled 2011-12-27: qty 5

## 2011-12-27 MED ORDER — FENTANYL CITRATE 0.05 MG/ML IJ SOLN
INTRAMUSCULAR | Status: AC
  Filled 2011-12-27: qty 2

## 2011-12-27 MED ORDER — OXYTOCIN 10 UNIT/ML IJ SOLN
INTRAMUSCULAR | Status: AC
  Filled 2011-12-27: qty 4

## 2011-12-27 MED ORDER — ONDANSETRON HCL 4 MG/2ML IJ SOLN
INTRAMUSCULAR | Status: AC
  Filled 2011-12-27: qty 2

## 2011-12-27 MED ORDER — LANOLIN HYDROUS EX OINT: 1.0000 "application " | TOPICAL_OINTMENT | CUTANEOUS | Status: DC | PRN

## 2011-12-27 MED ORDER — NALOXONE HCL 1 MG/ML IJ SOLN
1.0000 ug/kg/h | INTRAVENOUS | Status: DC | PRN
  Filled 2011-12-27: qty 2

## 2011-12-27 MED ORDER — WITCH HAZEL-GLYCERIN EX PADS: 1.0000 "application " | MEDICATED_PAD | CUTANEOUS | Status: DC | PRN

## 2011-12-27 MED ORDER — BUPIVACAINE IN DEXTROSE 0.75-8.25 % IT SOLN
INTRATHECAL | Status: DC | PRN
  Administered 2011-12-27: 1.6 mL via INTRATHECAL

## 2011-12-27 MED ORDER — EPHEDRINE 5 MG/ML INJ
INTRAVENOUS | Status: AC
  Filled 2011-12-27: qty 10

## 2011-12-27 MED ORDER — NALBUPHINE HCL 10 MG/ML IJ SOLN
5.0000 mg | INTRAMUSCULAR | Status: DC | PRN
  Administered 2011-12-27: 10 mg via INTRAVENOUS
  Filled 2011-12-27 (×2): qty 1

## 2011-12-27 MED ORDER — OXYCODONE-ACETAMINOPHEN 5-325 MG PO TABS
1.0000 | ORAL_TABLET | ORAL | Status: DC | PRN
  Administered 2011-12-28 – 2011-12-29 (×4): 1 via ORAL
  Filled 2011-12-27 (×4): qty 1

## 2011-12-27 MED ORDER — EPHEDRINE 5 MG/ML INJ: 10.0000 mg | INTRAVENOUS | Status: DC | PRN

## 2011-12-27 MED ORDER — LACTATED RINGERS IV SOLN: INTRAVENOUS | Status: DC

## 2011-12-27 SURGICAL SUPPLY — 46 items
APL SKNCLS STERI-STRIP NONHPOA (GAUZE/BANDAGES/DRESSINGS) ×1
BARRIER ADHS 3X4 INTERCEED (GAUZE/BANDAGES/DRESSINGS) ×1 IMPLANT
BENZOIN TINCTURE PRP APPL 2/3 (GAUZE/BANDAGES/DRESSINGS) ×2 IMPLANT
BRR ADH 4X3 ABS CNTRL BYND (GAUZE/BANDAGES/DRESSINGS) ×1
CLOTH BEACON ORANGE TIMEOUT ST (SAFETY) ×2 IMPLANT
CONTAINER PREFILL 10% NBF 15ML (MISCELLANEOUS) IMPLANT
DRESSING TELFA 8X3 (GAUZE/BANDAGES/DRESSINGS) ×2 IMPLANT
DRSG COVADERM 4X10 (GAUZE/BANDAGES/DRESSINGS) ×1 IMPLANT
DURAPREP 26ML APPLICATOR (WOUND CARE) ×2 IMPLANT
ELECT REM PT RETURN 9FT ADLT (ELECTROSURGICAL) ×2
ELECTRODE REM PT RTRN 9FT ADLT (ELECTROSURGICAL) ×1 IMPLANT
EXTRACTOR VACUUM M CUP 4 TUBE (SUCTIONS) IMPLANT
GAUZE SPONGE 4X4 12PLY STRL LF (GAUZE/BANDAGES/DRESSINGS) ×2 IMPLANT
GLOVE BIOGEL M 6.5 STRL (GLOVE) ×4 IMPLANT
GLOVE BIOGEL PI IND STRL 6.5 (GLOVE) ×2 IMPLANT
GLOVE BIOGEL PI INDICATOR 6.5 (GLOVE) ×2
GOWN PREVENTION PLUS LG XLONG (DISPOSABLE) ×2 IMPLANT
GOWN PREVENTION PLUS XLARGE (GOWN DISPOSABLE) ×2 IMPLANT
KIT ABG SYR 3ML LUER SLIP (SYRINGE) IMPLANT
NDL HYPO 25X5/8 SAFETYGLIDE (NEEDLE) IMPLANT
NEEDLE HYPO 25X5/8 SAFETYGLIDE (NEEDLE) IMPLANT
NS IRRIG 1000ML POUR BTL (IV SOLUTION) ×2 IMPLANT
PACK C SECTION WH (CUSTOM PROCEDURE TRAY) ×2 IMPLANT
PAD ABD 7.5X8 STRL (GAUZE/BANDAGES/DRESSINGS) ×2 IMPLANT
PAD OB MATERNITY 4.3X12.25 (PERSONAL CARE ITEMS) IMPLANT
RETAINER VISCERAL (MISCELLANEOUS) ×1 IMPLANT
RTRCTR C-SECT PINK 25CM LRG (MISCELLANEOUS) ×1 IMPLANT
RTRCTR C-SECT PINK 34CM XLRG (MISCELLANEOUS) IMPLANT
SLEEVE SCD COMPRESS KNEE MED (MISCELLANEOUS) IMPLANT
STAPLER VISISTAT 35W (STAPLE) IMPLANT
STRIP CLOSURE SKIN 1/2X4 (GAUZE/BANDAGES/DRESSINGS) ×2 IMPLANT
SUT PDS AB 0 CT1 27 (SUTURE) ×4 IMPLANT
SUT PLAIN 0 NONE (SUTURE) IMPLANT
SUT VIC AB 0 CTX 36 (SUTURE) ×12
SUT VIC AB 0 CTX36XBRD ANBCTRL (SUTURE) ×3 IMPLANT
SUT VIC AB 2-0 CT1 27 (SUTURE) ×4
SUT VIC AB 2-0 CT1 TAPERPNT 27 (SUTURE) ×1 IMPLANT
SUT VIC AB 2-0 SH 27 (SUTURE) ×2
SUT VIC AB 2-0 SH 27XBRD (SUTURE) IMPLANT
SUT VIC AB 3-0 SH 27 (SUTURE)
SUT VIC AB 3-0 SH 27X BRD (SUTURE) IMPLANT
SUT VIC AB 4-0 KS 27 (SUTURE) ×2 IMPLANT
TAPE CLOTH SURG 4X10 WHT LF (GAUZE/BANDAGES/DRESSINGS) ×1 IMPLANT
TOWEL OR 17X24 6PK STRL BLUE (TOWEL DISPOSABLE) ×4 IMPLANT
TRAY FOLEY CATH 14FR (SET/KITS/TRAYS/PACK) ×2 IMPLANT
WATER STERILE IRR 1000ML POUR (IV SOLUTION) ×2 IMPLANT

## 2011-12-27 NOTE — Anesthesia Preprocedure Evaluation (Signed)
Anesthesia Evaluation  Patient identified by MRN, date of birth, ID band Patient awake    Reviewed: Allergy & Precautions, H&P , NPO status , Patient's Chart, lab work & pertinent test results  Airway Mallampati: III TM Distance: >3 FB Neck ROM: full    Dental No notable dental hx. (+) Teeth Intact   Pulmonary neg pulmonary ROS,  breath sounds clear to auscultation  Pulmonary exam normal       Cardiovascular hypertension, Pt. on medications and Pt. on home beta blockers negative cardio ROS  Rhythm:regular Rate:Normal     Neuro/Psych negative neurological ROS  negative psych ROS   GI/Hepatic negative GI ROS, Neg liver ROS,   Endo/Other  Morbid obesity  Renal/GU negative Renal ROS  negative genitourinary   Musculoskeletal   Abdominal Normal abdominal exam  (+)   Peds  Hematology negative hematology ROS (+)   Anesthesia Other Findings   Reproductive/Obstetrics (+) Pregnancy                           Anesthesia Physical  Anesthesia Plan  ASA: III and emergent  Anesthesia Plan: Epidural   Post-op Pain Management:    Induction:   Airway Management Planned:   Additional Equipment:   Intra-op Plan:   Post-operative Plan:   Informed Consent: I have reviewed the patients History and Physical, chart, labs and discussed the procedure including the risks, benefits and alternatives for the proposed anesthesia with the patient or authorized representative who has indicated his/her understanding and acceptance.   Dental Advisory Given  Plan Discussed with: Anesthesiologist, CRNA and Surgeon  Anesthesia Plan Comments:         Anesthesia Quick Evaluation

## 2011-12-27 NOTE — Addendum Note (Signed)
Addendum  created 12/27/11 1755 by Shanon Payor, CRNA   Modules edited:Notes Section

## 2011-12-27 NOTE — Anesthesia Postprocedure Evaluation (Signed)
Anesthesia Post Note  Patient: Penny Ramirez  Procedure(s) Performed: Procedure(s) (LRB): CESAREAN SECTION (N/A)  Anesthesia type: Spinal  Patient location: PACU  Post pain: Pain level controlled  Post assessment: Post-op Vital signs reviewed  Last Vitals:  Filed Vitals:   12/27/11 0945  BP: 158/70  Pulse: 80  Temp:   Resp: 16    Post vital signs: Reviewed  Level of consciousness: awake  Complications: No apparent anesthesia complications

## 2011-12-27 NOTE — Anesthesia Procedure Notes (Signed)
Spinal  Patient location during procedure: OR Start time: 12/27/2011 7:56 AM Staffing Anesthesiologist: Joelys Staubs A. Performed by: anesthesiologist  Preanesthetic Checklist Completed: patient identified, site marked, surgical consent, pre-op evaluation, timeout performed, IV checked, risks and benefits discussed and monitors and equipment checked Spinal Block Patient position: sitting Prep: site prepped and draped and DuraPrep Patient monitoring: heart rate, cardiac monitor, continuous pulse ox and blood pressure Approach: midline Location: L3-4 Injection technique: single-shot Needle Needle type: Sprotte  Needle gauge: 24 G Needle length: 9 cm Needle insertion depth: 7 cm Assessment Sensory level: T4 Events: paresthesia Additional Notes Patient tolerated procedure. Transient paresthesia right leg. Adequate sensory level.

## 2011-12-27 NOTE — Plan of Care (Signed)
Problem: Phase I Progression Outcomes Goal: Pain controlled with appropriate interventions Outcome: Not Met (add Reason) Pt waiting for epidural; unable to get epidural prior to going for c/s. Goal: Adequate progression of labor Outcome: Not Met (add Reason) Fetal intolerance of labor  Problem: Phase II Progression Outcomes Goal: Delivery infant/placenta Outcome: Not Met (add Reason) c/s

## 2011-12-27 NOTE — Anesthesia Preprocedure Evaluation (Signed)
Anesthesia Evaluation  Patient identified by MRN, date of birth, ID band Patient awake    Reviewed: Allergy & Precautions, H&P , Patient's Chart, lab work & pertinent test results  Airway Mallampati: III TM Distance: >3 FB Neck ROM: full    Dental No notable dental hx. (+) Teeth Intact   Pulmonary neg pulmonary ROS,  breath sounds clear to auscultation  Pulmonary exam normal       Cardiovascular hypertension, Pt. on medications and Pt. on home beta blockers negative cardio ROS  Rhythm:regular Rate:Normal     Neuro/Psych negative neurological ROS  negative psych ROS   GI/Hepatic negative GI ROS, Neg liver ROS,   Endo/Other  Morbid obesity  Renal/GU negative Renal ROS  negative genitourinary   Musculoskeletal   Abdominal Normal abdominal exam  (+)   Peds  Hematology negative hematology ROS (+)   Anesthesia Other Findings   Reproductive/Obstetrics (+) Pregnancy                           Anesthesia Physical Anesthesia Plan  ASA: II  Anesthesia Plan: Epidural   Post-op Pain Management:    Induction:   Airway Management Planned:   Additional Equipment:   Intra-op Plan:   Post-operative Plan:   Informed Consent: I have reviewed the patients History and Physical, chart, labs and discussed the procedure including the risks, benefits and alternatives for the proposed anesthesia with the patient or authorized representative who has indicated his/her understanding and acceptance.     Plan Discussed with: Anesthesiologist and Surgeon  Anesthesia Plan Comments:         Anesthesia Quick Evaluation

## 2011-12-27 NOTE — Transfer of Care (Signed)
Immediate Anesthesia Transfer of Care Note  Patient: Penny Ramirez  Procedure(s) Performed: Procedure(s) (LRB) with comments: CESAREAN SECTION (N/A)  Patient Location: PACU  Anesthesia Type:Spinal  Level of Consciousness: awake, alert  and oriented  Airway & Oxygen Therapy: Patient Spontanous Breathing  Post-op Assessment: Report given to PACU RN and Post -op Vital signs reviewed and stable  Post vital signs: Reviewed and stable  Complications: No apparent anesthesia complications

## 2011-12-27 NOTE — Progress Notes (Signed)
Called Dr. Richardson Dopp for orders for something to help pt relax. SVE reported to MD, FHR, UC pattern also reported. MD aware. Orders to continue to increase Pitocin at this time and Pt may have Ambien 5mg  PO.

## 2011-12-27 NOTE — Progress Notes (Signed)
Penny Ramirez is a 41 y.o. G3P2002 at [redacted]w[redacted]d by LMP admitted for induction of labor due to Post dates. Due date 12/26/2011 and chronic hypertension as well as patient request.  Subjective:  pt states contraction are 8 out of 10 and she would like to have an epidural.   Objective: BP 120/64  Pulse 78  Temp 98.7 F (37.1 C) (Oral)  Resp 20  Ht 5' 4.5" (1.638 m)  Wt 108.863 kg (240 lb)  BMI 40.56 kg/m2  LMP 03/21/2011      FHT:  Baseline 140s with minimal varibility no accelerations and  repetitive variable decelerations  UC:   regular, every 2 minutes SVE:   Dilation: 2 Effacement (%): 70 Station: -2 Exam by:: Dr Richardson Dopp  Labs: Lab Results  Component Value Date   WBC 11.3* 12/27/2011   HGB 11.6* 12/27/2011   HCT 34.1* 12/27/2011   MCV 89.3 12/27/2011   PLT 213 12/27/2011    Assessment / Plan: Induction of labor due to chronic hypertension and social request ,  progressing well on pitocin  Labor: pt in latent labor  Preeclampsia:  no signs or symptoms of toxicity Fetal Wellbeing:  Category II Pain Control:  none  I/D:  penicillin for gbs status  Anticipated MOD:  if category II tracing plan to proceed with cesarean section... r/b/a of cesarean section discussed with patient including but not limited to infection bleeding damage to bowel bladder baby with the need for further surgery . R/o transfusion HIV/ Hep B&C discussed.  Pt voiced understanding and desires to proceed with cesarean section .  Torien Ramroop J. 12/27/2011, 7:20 AM

## 2011-12-27 NOTE — Progress Notes (Signed)
Awaiting anesthesia

## 2011-12-27 NOTE — Op Note (Signed)
Cesarean Section Procedure Note  Indications: non-reassuring fetal status  Pre-operative Diagnosis: 40 week 1 day pregnancy.  Post-operative Diagnosis: same  Surgeon: Jessee Avers.   Assistants: None  Anesthesia: Spinal anesthesia  ASA Class: 2  Procedure .[name] Low transverse cesarean section.    Procedure Details   The patient was seen in the Holding Room. The risks, benefits, complications, treatment options, and expected outcomes were discussed with the patient.  The patient concurred with the proposed plan, giving informed consent.  The site of surgery properly noted/marked. The patient was taken to Operating Room # 9, identified as ADYN HOES and the procedure verified as C-Section Delivery. A Time Out was held and the above information confirmed.  After induction of anesthesia, the patient was draped and prepped in the usual sterile manner. A Pfannenstiel incision was made and carried down through the subcutaneous tissue to the fascia. Fascial incision was made and extended transversely. The fascia was separated from the underlying rectus tissue superiorly and inferiorly. The peritoneum was identified and entered. Peritoneal incision was extended longitudinally. The utero-vesical peritoneal reflection was incised transversely and the bladder flap was bluntly freed from the lower uterine segment. A low transverse uterine incision was made. Delivered from cephalic presentation was a ???? gram Female with Apgar scores of 8 at one minute and 9 at five minutes. After the umbilical cord was clamped and cut cord blood was obtained for evaluation. The placenta was removed intact and appeared normal. The uterine outline, tubes and ovaries appeared normal. The uterine incision was closed with running locked sutures of 0 vicryl and a second layer of 0 vicryl was used for imbrication. Hemostasis was observed. Lavage was carried out until clear. interseed was placed along the low transverse incision   The fascia was then reapproximated with running sutures of 0 pds. The skin was reapproximated with 4-0 vicryl.  Instrument, sponge, and needle counts were correct prior the abdominal closure and at the conclusion of the case.   Findings:  female infant in the cephalic presentation.. With nuchal cord x 1.... Normal fallopian tubes and ovaries   Estimated Blood Loss:  1000 ml         Drains: foley cathetere          Total IV Fluids:  Per anesthesia ml         Specimens: Placenta and Disposition:  Sent to Pathology          Implants: none         Complications:  None; patient tolerated the procedure well.         Disposition: PACU - hemodynamically stable.         Condition: stable  Attending Attestation: I performed the procedure.

## 2011-12-27 NOTE — Progress Notes (Signed)
Telephone call to Dr. Richardson Dopp requesting C-S postpartum orders be put in EPIC. Postpartum orders were placed, but post anesthesia orders were deleted by mistake; Dr. Richardson Dopp requested RN to place post anesthesia orders per verbal order. Post anesthesia orders placed.

## 2011-12-27 NOTE — Progress Notes (Signed)
Awaiting lab

## 2011-12-27 NOTE — Anesthesia Postprocedure Evaluation (Signed)
  Anesthesia Post-op Note  Patient: Penny Ramirez  Procedure(s) Performed: Procedure(s) (LRB) with comments: CESAREAN SECTION (N/A)  Patient Location: Mother/Baby  Anesthesia Type:Spinal  Level of Consciousness: awake, alert  and oriented  Airway and Oxygen Therapy: Patient Spontanous Breathing  Post-op Pain: none  Post-op Assessment: Post-op Vital signs reviewed and Patient's Cardiovascular Status Stable  Post-op Vital Signs: Reviewed and stable  Complications: No apparent anesthesia complications

## 2011-12-28 ENCOUNTER — Encounter (HOSPITAL_COMMUNITY): Payer: Self-pay | Admitting: Obstetrics and Gynecology

## 2011-12-28 LAB — CBC
MCH: 30.2 pg (ref 26.0–34.0)
Platelets: 167 10*3/uL (ref 150–400)
RBC: 2.98 MIL/uL — ABNORMAL LOW (ref 3.87–5.11)
RDW: 14.2 % (ref 11.5–15.5)
WBC: 10.8 10*3/uL — ABNORMAL HIGH (ref 4.0–10.5)

## 2011-12-28 NOTE — Progress Notes (Signed)
Subjective: Postpartum Day 1: Cesarean Delivery Patient reports no problems voiding.    Objective: Vital signs in last 24 hours: Temp:  [97.7 F (36.5 C)-98.5 F (36.9 C)] 98.3 F (36.8 C) (11/28 0350) Pulse Rate:  [69-115] 76  (11/28 0350) Resp:  [16-18] 18  (11/28 0350) BP: (107-134)/(66-83) 112/70 mmHg (11/28 0350) SpO2:  [95 %-98 %] 98 % (11/28 0350)  Physical Exam:  General: alert, cooperative and no distress Lochia: appropriate Uterine Fundus: firm Incision: healing well, no significant drainage DVT Evaluation: No evidence of DVT seen on physical exam. Negative Homan's sign. No cords or calf tenderness.   Basename 12/28/11 0525 12/27/11 0638  HGB 9.0* 11.6*  HCT 26.8* 34.1*    Assessment/Plan: Status post Cesarean section. Doing well postoperatively. acute blood loss anemia  Continue current care.  Penny Ramirez H. 12/28/2011, 10:11 AM

## 2011-12-29 MED ORDER — OXYCODONE-ACETAMINOPHEN 5-325 MG PO TABS: 1.0000 | ORAL_TABLET | ORAL | Status: DC | PRN

## 2011-12-29 NOTE — Discharge Summary (Signed)
Obstetric Discharge Summary Reason for Admission: induction of labor and for elevated BP Prenatal Procedures: none Intrapartum Procedures: cesarean: low cervical, transverse Postpartum Procedures: none Complications-Operative and Postpartum: none Hemoglobin  Date Value Range Status  12/28/2011 9.0* 12.0 - 15.0 g/dL Final     DELTA CHECK NOTED     REPEATED TO VERIFY     HCT  Date Value Range Status  12/28/2011 26.8* 36.0 - 46.0 % Final    Physical Exam:  General: alert, cooperative and no distress Lochia: appropriate Uterine Fundus: firm Incision: healing well, slight stepoff at the left 1/3 margin and no evidence of infection DVT Evaluation: No evidence of DVT seen on physical exam. Negative Homan's sign. No cords or calf tenderness.  Discharge Diagnoses: Term Pregnancy-delivered and s/p CSx  Discharge Information: Date: 12/29/2011 Activity: pelvic rest and no heavy lifting Diet: routine Medications: Ibuprofen, Percocet and percocet Condition: stable Instructions: refer to practice specific booklet Discharge to: home   Newborn Data: Live born female  Birth Weight: 7 lb 15.9 oz (3625 g) APGAR: 8, 9  Home with mother.  Penny Ramirez. 12/29/2011, 9:39 AM

## 2011-12-29 NOTE — Progress Notes (Signed)
Patient ID: Penny Ramirez, female   DOB: 10-02-1970, 41 y.o.   MRN: [id number] See dc summary

## 2012-11-11 ENCOUNTER — Other Ambulatory Visit: Payer: Self-pay

## 2012-11-11 DIAGNOSIS — Z1231 Encounter for screening mammogram for malignant neoplasm of breast: Secondary | ICD-10-CM

## 2012-11-11 DIAGNOSIS — Z9889 Other specified postprocedural states: Secondary | ICD-10-CM

## 2012-11-13 ENCOUNTER — Ambulatory Visit
Admission: RE | Admit: 2012-11-13 | Discharge: 2012-11-13 | Disposition: A | Discharge status: Home / Self Care | Payer: 59 | Source: Ambulatory Visit

## 2012-11-13 DIAGNOSIS — Z9889 Other specified postprocedural states: Secondary | ICD-10-CM

## 2012-11-13 DIAGNOSIS — Z1231 Encounter for screening mammogram for malignant neoplasm of breast: Secondary | ICD-10-CM

## 2013-03-13 ENCOUNTER — Encounter (HOSPITAL_COMMUNITY): Payer: Self-pay | Admitting: Emergency Medicine

## 2013-03-13 ENCOUNTER — Emergency Department (HOSPITAL_COMMUNITY)
Admission: EM | Admit: 2013-03-13 | Discharge: 2013-03-13 | Disposition: A | Discharge status: Home / Self Care | Payer: 59 | Source: Home / Self Care | Attending: Emergency Medicine | Admitting: Emergency Medicine

## 2013-03-13 DIAGNOSIS — N3000 Acute cystitis without hematuria: Secondary | ICD-10-CM

## 2013-03-13 LAB — POCT URINALYSIS DIP (DEVICE)
BILIRUBIN URINE: NEGATIVE
Glucose, UA: NEGATIVE mg/dL
Ketones, ur: NEGATIVE mg/dL
Nitrite: NEGATIVE
PROTEIN: 30 mg/dL — AB
Specific Gravity, Urine: 1.01 (ref 1.005–1.030)
Urobilinogen, UA: 0.2 mg/dL (ref 0.0–1.0)
pH: 7 (ref 5.0–8.0)

## 2013-03-13 LAB — POCT PREGNANCY, URINE: PREG TEST UR: NEGATIVE

## 2013-03-13 MED ORDER — PHENAZOPYRIDINE HCL 200 MG PO TABS: 200.0000 mg | ORAL_TABLET | Freq: Three times a day (TID) | ORAL | Status: DC | PRN

## 2013-03-13 MED ORDER — CEPHALEXIN 500 MG PO CAPS: 500.0000 mg | ORAL_CAPSULE | Freq: Three times a day (TID) | ORAL | Status: DC

## 2013-03-13 NOTE — ED Notes (Signed)
C/o sx of urinary tract infection that include pain with urination, cloudiness, and slight odor that started 2 days ago. Pain is 5/10. Tried drinking water and cranberry juice. Denies any discharge, n/v/d. Written by: Lenore Manner, SMA

## 2013-03-13 NOTE — Discharge Instructions (Signed)
Urinary Tract Infection  Urinary tract infections (UTIs) can develop anywhere along your urinary tract. Your urinary tract is your body's drainage system for removing wastes and extra water. Your urinary tract includes two kidneys, two ureters, a bladder, and a urethra. Your kidneys are a pair of bean-shaped organs. Each kidney is about the size of your fist. They are located below your ribs, one on each side of your spine.  CAUSES  Infections are caused by microbes, which are microscopic organisms, including fungi, viruses, and bacteria. These organisms are so small that they can only be seen through a microscope. Bacteria are the microbes that most commonly cause UTIs.  SYMPTOMS   Symptoms of UTIs may vary by age and gender of the patient and by the location of the infection. Symptoms in young women typically include a frequent and intense urge to urinate and a painful, burning feeling in the bladder or urethra during urination. Older women and men are more likely to be tired, shaky, and weak and have muscle aches and abdominal pain. A fever may mean the infection is in your kidneys. Other symptoms of a kidney infection include pain in your back or sides below the ribs, nausea, and vomiting.  DIAGNOSIS  To diagnose a UTI, your caregiver will ask you about your symptoms. Your caregiver also will ask to provide a urine sample. The urine sample will be tested for bacteria and white blood cells. White blood cells are made by your body to help fight infection.  TREATMENT   Typically, UTIs can be treated with medication. Because most UTIs are caused by a bacterial infection, they usually can be treated with the use of antibiotics. The choice of antibiotic and length of treatment depend on your symptoms and the type of bacteria causing your infection.  HOME CARE INSTRUCTIONS   If you were prescribed antibiotics, take them exactly as your caregiver instructs you. Finish the medication even if you feel better after you  have only taken some of the medication.   Drink enough water and fluids to keep your urine clear or pale yellow.   Avoid caffeine, tea, and carbonated beverages. They tend to irritate your bladder.   Empty your bladder often. Avoid holding urine for long periods of time.   Empty your bladder before and after sexual intercourse.   After a bowel movement, women should cleanse from front to back. Use each tissue only once.  SEEK MEDICAL CARE IF:    You have back pain.   You develop a fever.   Your symptoms do not begin to resolve within 3 days.  SEEK IMMEDIATE MEDICAL CARE IF:    You have severe back pain or lower abdominal pain.   You develop chills.   You have nausea or vomiting.   You have continued burning or discomfort with urination.  MAKE SURE YOU:    Understand these instructions.   Will watch your condition.   Will get help right away if you are not doing well or get worse.  Document Released: 10/26/2004 Document Revised: 07/18/2011 Document Reviewed: 02/24/2011  [employer] Patient Information 2014 [employer]

## 2013-03-13 NOTE — ED Provider Notes (Signed)
Chief Complaint   Chief Complaint  Patient presents with  . Urinary Tract Infection    History of Present Illness   Penny Ramirez is a 43 year old female who has had a two-day history of dysuria, frequency, and urgency. She denies any hematuria. She has had no back pain and only minimal suprapubic pain. She denies fever, chills, nausea, or vomiting. No GYN symptoms. She has had urinary tract infections and stones in the past. At one time she was on prophylactic Macrobid but has gone off that. She denies any obvious precipitating factors.  Review of Systems   Other than as noted above, the patient denies any of the following symptoms: General:  No fevers, chills, or sweats. GI:  No abdominal pain, back pain, nausea, vomiting, diarrhea, or constipation. GU:  No dysuria, frequency, urgency, hematuria, or incontinence. GYN:  No discharge, itching, vulvar pain or lesions, pelvic pain, or abnormal vaginal bleeding.  [place]   Past medical history, family history, social history, meds, and allergies were reviewed.  She takes Benicar/HCTZ for her blood pressure.  Physical Examination     Vital signs:  BP 143/98  Pulse 87  Temp(Src) 98.2 F (36.8 C) (Oral)  Resp 18  SpO2 100%  LMP 03/02/2013 Gen:  Alert, oriented, in no distress. Lungs:  Clear to auscultation, no wheezes, rales or rhonchi. Heart:  Regular rhythm, no gallop or murmer. Abdomen:  Flat and soft. There was slight suprapubic pain to palpation.  No guarding, or rebound.  No hepato-splenomegaly or mass.  Bowel sounds were normally active.  No hernia. Back:  No CVA tenderness.  Skin:  Clear, warm and dry.  Labs   Results for orders placed during the hospital encounter of 03/13/13  POCT URINALYSIS DIP (DEVICE)      Result Value Ref Range   Glucose, UA NEGATIVE  NEGATIVE mg/dL   Bilirubin Urine NEGATIVE  NEGATIVE   Ketones, ur NEGATIVE  NEGATIVE mg/dL   Specific Gravity, Urine 1.010  1.005 - 1.030   Hgb urine dipstick  LARGE (*) NEGATIVE   pH 7.0  5.0 - 8.0   Protein, ur 30 (*) NEGATIVE mg/dL   Urobilinogen, UA 0.2  0.0 - 1.0 mg/dL   Nitrite NEGATIVE  NEGATIVE   Leukocytes, UA LARGE (*) NEGATIVE  POCT PREGNANCY, URINE      Result Value Ref Range   Preg Test, Ur NEGATIVE  NEGATIVE    A urine culture was obtained.  Results are pending at this time and we will call about any positive results.  Assessment   The encounter diagnosis was Acute cystitis.   No evidence of pyelonephritis.    Plan   1.  Meds:  The following meds were prescribed:   New Prescriptions   CEPHALEXIN (KEFLEX) 500 MG CAPSULE    Take 1 capsule (500 mg total) by mouth 3 (three) times daily.   PHENAZOPYRIDINE (PYRIDIUM) 200 MG TABLET    Take 1 tablet (200 mg total) by mouth 3 (three) times daily as needed for pain.    2.  Patient Education/Counseling:  The patient was given appropriate handouts, self care instructions, and instructed in symptomatic relief. The patient was told to avoid intercourse for 10 days, get extra fluids, and return for a follow up with her primary care doctor at the completion of treatment for a repeat UA and culture.  Suggested probiotics for prevention of UTIs.  3.  Follow up:  The patient was told to follow up here if no better in  3 to 4 days, or sooner if becoming worse in any way, and given some red flag symptoms such as fever, persistent vomiting, or severe flank or abdominal pain which would prompt immediate return.     Harden Mo, MD 03/13/13 [phone]

## 2013-03-15 LAB — URINE CULTURE

## 2013-03-15 NOTE — Progress Notes (Signed)
Quick Note:  Results are abnormal as noted, but have been adequately treated. No further action necessary. ______

## 2013-03-16 NOTE — ED Notes (Signed)
Urine culture: >100,000 colonies E. Coli.  Pt. adequately treated with Keflex. Roselyn Meier 03/16/2013

## 2013-04-30 ENCOUNTER — Ambulatory Visit: Payer: 59 | Admitting: Physician Assistant

## 2013-05-09 ENCOUNTER — Ambulatory Visit (INDEPENDENT_AMBULATORY_CARE_PROVIDER_SITE_OTHER): Payer: 59 | Admitting: Internal Medicine

## 2013-05-09 ENCOUNTER — Encounter: Payer: Self-pay | Admitting: Internal Medicine

## 2013-05-09 ENCOUNTER — Other Ambulatory Visit (INDEPENDENT_AMBULATORY_CARE_PROVIDER_SITE_OTHER): Payer: 59

## 2013-05-09 VITALS — BP 132/88 | HR 74 | Temp 98.7°F | Resp 16 | Ht <= 58 in | Wt 224.0 lb

## 2013-05-09 DIAGNOSIS — Z23 Encounter for immunization: Secondary | ICD-10-CM

## 2013-05-09 DIAGNOSIS — D509 Iron deficiency anemia, unspecified: Secondary | ICD-10-CM

## 2013-05-09 DIAGNOSIS — I10 Essential (primary) hypertension: Secondary | ICD-10-CM | POA: Insufficient documentation

## 2013-05-09 DIAGNOSIS — Z0000 Encounter for general adult medical examination without abnormal findings: Secondary | ICD-10-CM

## 2013-05-09 DIAGNOSIS — E876 Hypokalemia: Secondary | ICD-10-CM

## 2013-05-09 LAB — CBC WITH DIFFERENTIAL/PLATELET
Basophils Absolute: 0 10*3/uL (ref 0.0–0.1)
Basophils Relative: 0.5 % (ref 0.0–3.0)
Eosinophils Absolute: 0.3 10*3/uL (ref 0.0–0.7)
Eosinophils Relative: 4.4 % (ref 0.0–5.0)
HEMATOCRIT: 40.4 % (ref 36.0–46.0)
Hemoglobin: 13.4 g/dL (ref 12.0–15.0)
LYMPHS PCT: 33.7 % (ref 12.0–46.0)
Lymphs Abs: 2.5 10*3/uL (ref 0.7–4.0)
MCHC: 33.3 g/dL (ref 30.0–36.0)
MCV: 89 fl (ref 78.0–100.0)
MONOS PCT: 7.3 % (ref 3.0–12.0)
Monocytes Absolute: 0.5 10*3/uL (ref 0.1–1.0)
Neutro Abs: 4 10*3/uL (ref 1.4–7.7)
Neutrophils Relative %: 54.1 % (ref 43.0–77.0)
PLATELETS: 360 10*3/uL (ref 150.0–400.0)
RBC: 4.54 Mil/uL (ref 3.87–5.11)
RDW: 14 % (ref 11.5–14.6)
WBC: 7.5 10*3/uL (ref 4.5–10.5)

## 2013-05-09 LAB — COMPREHENSIVE METABOLIC PANEL
ALT: 17 U/L (ref 0–35)
AST: 17 U/L (ref 0–37)
Albumin: 4 g/dL (ref 3.5–5.2)
Alkaline Phosphatase: 76 U/L (ref 39–117)
BUN: 9 mg/dL (ref 6–23)
CALCIUM: 9.6 mg/dL (ref 8.4–10.5)
CO2: 29 mEq/L (ref 19–32)
Chloride: 100 mEq/L (ref 96–112)
Creatinine, Ser: 0.9 mg/dL (ref 0.4–1.2)
GFR: 92.33 mL/min (ref >60.00)
GLUCOSE: 87 mg/dL (ref 70–99)
Potassium: 3.2 mEq/L — ABNORMAL LOW (ref 3.5–5.1)
SODIUM: 136 meq/L (ref 135–145)
TOTAL PROTEIN: 7.8 g/dL (ref 6.0–8.3)
Total Bilirubin: 0.5 mg/dL (ref 0.3–1.2)

## 2013-05-09 LAB — TSH: TSH: 1.21 u[IU]/mL (ref 0.35–5.50)

## 2013-05-09 LAB — URINALYSIS, ROUTINE W REFLEX MICROSCOPIC
Bilirubin Urine: NEGATIVE
KETONES UR: NEGATIVE
Leukocytes, UA: NEGATIVE
Nitrite: NEGATIVE
PH: 7 (ref 5.0–8.0)
Specific Gravity, Urine: 1.01 (ref 1.000–1.030)
TOTAL PROTEIN, URINE-UPE24: NEGATIVE
URINE GLUCOSE: NEGATIVE
Urobilinogen, UA: 0.2 (ref 0.0–1.0)

## 2013-05-09 LAB — FOLATE: FOLATE: 21.6 ng/mL (ref >5.9)

## 2013-05-09 LAB — LIPID PANEL
Cholesterol: 208 mg/dL — ABNORMAL HIGH (ref 0–200)
HDL: 36.8 mg/dL — AB (ref >39.00)
LDL Cholesterol: 153 mg/dL — ABNORMAL HIGH (ref 0–99)
TRIGLYCERIDES: 90 mg/dL (ref 0.0–149.0)
Total CHOL/HDL Ratio: 6
VLDL: 18 mg/dL (ref 0.0–40.0)

## 2013-05-09 LAB — FERRITIN: FERRITIN: 34 ng/mL (ref 10.0–291.0)

## 2013-05-09 LAB — IBC PANEL
IRON: 79 ug/dL (ref 42–145)
Saturation Ratios: 17.3 % — ABNORMAL LOW (ref 20.0–50.0)
Transferrin: 326.2 mg/dL (ref 212.0–360.0)

## 2013-05-09 LAB — VITAMIN B12: Vitamin B-12: 665 pg/mL (ref 211–911)

## 2013-05-09 MED ORDER — POTASSIUM CHLORIDE 20 MEQ PO PACK: 20.0000 meq | PACK | Freq: Two times a day (BID) | ORAL | Status: DC

## 2013-05-09 MED ORDER — LOSARTAN POTASSIUM 100 MG PO TABS: 100.0000 mg | ORAL_TABLET | Freq: Every day | ORAL | Status: DC

## 2013-05-09 NOTE — Assessment & Plan Note (Signed)
Her BP is well controlled 

## 2013-05-09 NOTE — Progress Notes (Signed)
Pre visit review using our clinic review tool, if applicable. No additional management support is needed unless otherwise documented below in the visit note. 

## 2013-05-09 NOTE — Patient Instructions (Signed)
Preventive Care for Adults, Female A healthy lifestyle and preventive care can promote health and wellness. Preventive health guidelines for women include the following key practices.  A routine yearly physical is a good way to check with your health care provider about your health and preventive screening. It is a chance to share any concerns and updates on your health and to receive a thorough exam.  Visit your dentist for a routine exam and preventive care every 6 months. Brush your teeth twice a day and floss once a day. Good oral hygiene prevents tooth decay and gum disease.  The frequency of eye exams is based on your age, health, family medical history, use of contact lenses, and other factors. Follow your health care provider's recommendations for frequency of eye exams.  Eat a healthy diet. Foods like vegetables, fruits, whole grains, low-fat dairy products, and lean protein foods contain the nutrients you need without too many calories. Decrease your intake of foods high in solid fats, added sugars, and salt. Eat the right amount of calories for you.Get information about a proper diet from your health care provider, if necessary.  Regular physical exercise is one of the most important things you can do for your health. Most adults should get at least 150 minutes of moderate-intensity exercise (any activity that increases your heart rate and causes you to sweat) each week. In addition, most adults need muscle-strengthening exercises on 2 or more days a week.  Maintain a healthy weight. The body mass index (BMI) is a screening tool to identify possible weight problems. It provides an estimate of body fat based on height and weight. Your health care provider can find your BMI, and can help you achieve or maintain a healthy weight.For adults 20 years and older:  A BMI below 18.5 is considered underweight.  A BMI of 18.5 to 24.9 is normal.  A BMI of 25 to 29.9 is considered overweight.  A  BMI of 30 and above is considered obese.  Maintain normal blood lipids and cholesterol levels by exercising and minimizing your intake of saturated fat. Eat a balanced diet with plenty of fruit and vegetables. Blood tests for lipids and cholesterol should begin at age 62 and be repeated every 5 years. If your lipid or cholesterol levels are high, you are over 50, or you are at high risk for heart disease, you may need your cholesterol levels checked more frequently.Ongoing high lipid and cholesterol levels should be treated with medicines if diet and exercise are not working.  If you smoke, find out from your health care provider how to quit. If you do not use tobacco, do not start.  Lung cancer screening is recommended for adults aged 36 80 years who are at high risk for developing lung cancer because of a history of smoking. A yearly low-dose CT scan of the lungs is recommended for people who have at least a 30-pack-year history of smoking and are a current smoker or have quit within the past 15 years. A pack year of smoking is smoking an average of 1 pack of cigarettes a day for 1 year (for example: 1 pack a day for 30 years or 2 packs a day for 15 years). Yearly screening should continue until the smoker has stopped smoking for at least 15 years. Yearly screening should be stopped for people who develop a health problem that would prevent them from having lung cancer treatment.  If you are pregnant, do not drink alcohol. If you  are breastfeeding, be very cautious about drinking alcohol. If you are not pregnant and choose to drink alcohol, do not have more than 1 drink per day. One drink is considered to be 12 ounces (355 mL) of beer, 5 ounces (148 mL) of wine, or 1.5 ounces (44 mL) of liquor.  Avoid use of street drugs. Do not share needles with anyone. Ask for help if you need support or instructions about stopping the use of drugs.  High blood pressure causes heart disease and increases the risk  of stroke. Your blood pressure should be checked at least every 1 to 2 years. Ongoing high blood pressure should be treated with medicines if weight loss and exercise do not work.  If you are 39 41 years old, ask your health care provider if you should take aspirin to prevent strokes.  Diabetes screening involves taking a blood sample to check your fasting blood sugar level. This should be done once every 3 years, after age 56, if you are within normal weight and without risk factors for diabetes. Testing should be considered at a younger age or be carried out more frequently if you are overweight and have at least 1 risk factor for diabetes.  Breast cancer screening is essential preventive care for women. You should practice "breast self-awareness." This means understanding the normal appearance and feel of your breasts and may include breast self-examination. Any changes detected, no matter how small, should be reported to a health care provider. Women in their 40s and 30s should have a clinical breast exam (CBE) by a health care provider as part of a regular health exam every 1 to 3 years. After age 28, women should have a CBE every year. Starting at age 72, women should consider having a mammogram (breast X-ray test) every year. Women who have a family history of breast cancer should talk to their health care provider about genetic screening. Women at a high risk of breast cancer should talk to their health care providers about having an MRI and a mammogram every year.  Breast cancer gene (BRCA)-related cancer risk assessment is recommended for women who have family members with BRCA-related cancers. BRCA-related cancers include breast, ovarian, tubal, and peritoneal cancers. Having family members with these cancers may be associated with an increased risk for harmful changes (mutations) in the breast cancer genes BRCA1 and BRCA2. Results of the assessment will determine the need for genetic counseling  and BRCA1 and BRCA2 testing.  The Pap test is a screening test for cervical cancer. A Pap test can show cell changes on the cervix that might become cervical cancer if left untreated. A Pap test is a procedure in which cells are obtained and examined from the lower end of the uterus (cervix).  Women should have a Pap test starting at age 59.  Between ages 42 and 13, Pap tests should be repeated every 2 years.  Beginning at age 53, you should have a Pap test every 3 years as long as the past 3 Pap tests have been normal.  Some women have medical problems that increase the chance of getting cervical cancer. Talk to your health care provider about these problems. It is especially important to talk to your health care provider if a new problem develops soon after your last Pap test. In these cases, your health care provider may recommend more frequent screening and Pap tests.  The above recommendations are the same for women who have or have not gotten the vaccine  for human papillomavirus (HPV).  If you had a hysterectomy for a problem that was not cancer or a condition that could lead to cancer, then you no longer need Pap tests. Even if you no longer need a Pap test, a regular exam is a good idea to make sure no other problems are starting.  If you are between ages 58 and 10 years, and you have had normal Pap tests going back 10 years, you no longer need Pap tests. Even if you no longer need a Pap test, a regular exam is a good idea to make sure no other problems are starting.  If you have had past treatment for cervical cancer or a condition that could lead to cancer, you need Pap tests and screening for cancer for at least 20 years after your treatment.  If Pap tests have been discontinued, risk factors (such as a new sexual partner) need to be reassessed to determine if screening should be resumed.  The HPV test is an additional test that may be used for cervical cancer screening. The HPV test  looks for the virus that can cause the cell changes on the cervix. The cells collected during the Pap test can be tested for HPV. The HPV test could be used to screen women aged 67 years and older, and should be used in women of any age who have unclear Pap test results. After the age of 65, women should have HPV testing at the same frequency as a Pap test.  Colorectal cancer can be detected and often prevented. Most routine colorectal cancer screening begins at the age of 25 years and continues through age 66 years. However, your health care provider may recommend screening at an earlier age if you have risk factors for colon cancer. On a yearly basis, your health care provider may provide home test kits to check for hidden blood in the stool. Use of a small camera at the end of a tube, to directly examine the colon (sigmoidoscopy or colonoscopy), can detect the earliest forms of colorectal cancer. Talk to your health care provider about this at age 79, when routine screening begins. Direct exam of the colon should be repeated every 5 10 years through age 47 years, unless early forms of pre-cancerous polyps or small growths are found.  People who are at an increased risk for hepatitis B should be screened for this virus. You are considered at high risk for hepatitis B if:  You were born in a country where hepatitis B occurs often. Talk with your health care provider about which countries are considered high risk.  Your parents were born in a high-risk country and you have not received a shot to protect against hepatitis B (hepatitis B vaccine).  You have HIV or AIDS.  You use needles to inject street drugs.  You live with, or have sex with, someone who has Hepatitis B.  You get hemodialysis treatment.  You take certain medicines for conditions like cancer, organ transplantation, and autoimmune conditions.  Hepatitis C blood testing is recommended for all people born from [birth year] through 1965 and  any individual with known risks for hepatitis C.  Practice safe sex. Use condoms and avoid high-risk sexual practices to reduce the spread of sexually transmitted infections (STIs). STIs include gonorrhea, chlamydia, syphilis, trichomonas, herpes, HPV, and human immunodeficiency virus (HIV). Herpes, HIV, and HPV are viral illnesses that have no cure. They can result in disability, cancer, and death. Sexually active women aged 66  years and younger should be checked for chlamydia. Older women with new or multiple partners should also be tested for chlamydia. Testing for other STIs is recommended if you are sexually active and at increased risk.  Osteoporosis is a disease in which the bones lose minerals and strength with aging. This can result in serious bone fractures or breaks. The risk of osteoporosis can be identified using a bone density scan. Women ages 18 years and over and women at risk for fractures or osteoporosis should discuss screening with their health care providers. Ask your health care provider whether you should take a calcium supplement or vitamin D to reduce the rate of osteoporosis.  Menopause can be associated with physical symptoms and risks. Hormone replacement therapy is available to decrease symptoms and risks. You should talk to your health care provider about whether hormone replacement therapy is right for you.  Use sunscreen. Apply sunscreen liberally and repeatedly throughout the day. You should seek shade when your shadow is shorter than you. Protect yourself by wearing long sleeves, pants, a wide-brimmed hat, and sunglasses year round, whenever you are outdoors.  Once a month, do a whole body skin exam, using a mirror to look at the skin on your back. Tell your health care provider of new moles, moles that have irregular borders, moles that are larger than a pencil eraser, or moles that have changed in shape or color.  Stay current with required vaccines  (immunizations).  Influenza vaccine. All adults should be immunized every year.  Tetanus, diphtheria, and acellular pertussis (Td, Tdap) vaccine. Pregnant women should receive 1 dose of Tdap vaccine during each pregnancy. The dose should be obtained regardless of the length of time since the last dose. Immunization is preferred during the 27th 36th week of gestation. An adult who has not previously received Tdap or who does not know her vaccine status should receive 1 dose of Tdap. This initial dose should be followed by tetanus and diphtheria toxoids (Td) booster doses every 10 years. Adults with an unknown or incomplete history of completing a 3-dose immunization series with Td-containing vaccines should begin or complete a primary immunization series including a Tdap dose. Adults should receive a Td booster every 10 years.  Varicella vaccine. An adult without evidence of immunity to varicella should receive 2 doses or a second dose if she has previously received 1 dose. Pregnant females who do not have evidence of immunity should receive the first dose after pregnancy. This first dose should be obtained before leaving the health care facility. The second dose should be obtained 4 8 weeks after the first dose.  Human papillomavirus (HPV) vaccine. Females aged 9 26 years who have not received the vaccine previously should obtain the 3-dose series. The vaccine is not recommended for use in pregnant females. However, pregnancy testing is not needed before receiving a dose. If a female is found to be pregnant after receiving a dose, no treatment is needed. In that case, the remaining doses should be delayed until after the pregnancy. Immunization is recommended for any person with an immunocompromised condition through the age of 51 years if she did not get any or all doses earlier. During the 3-dose series, the second dose should be obtained 4 8 weeks after the first dose. The third dose should be obtained  24 weeks after the first dose and 16 weeks after the second dose.  Zoster vaccine. One dose is recommended for adults aged 57 years or older unless certain  conditions are present.  Measles, mumps, and rubella (MMR) vaccine. Adults born before [birth year] generally are considered immune to measles and mumps. Adults born in [birth year] or later should have 1 or more doses of MMR vaccine unless there is a contraindication to the vaccine or there is laboratory evidence of immunity to each of the three diseases. A routine second dose of MMR vaccine should be obtained at least 28 days after the first dose for students attending postsecondary schools, health care workers, or international travelers. People who received inactivated measles vaccine or an unknown type of measles vaccine during 1963 1967 should receive 2 doses of MMR vaccine. People who received inactivated mumps vaccine or an unknown type of mumps vaccine before 1979 and are at high risk for mumps infection should consider immunization with 2 doses of MMR vaccine. For females of childbearing age, rubella immunity should be determined. If there is no evidence of immunity, females who are not pregnant should be vaccinated. If there is no evidence of immunity, females who are pregnant should delay immunization until after pregnancy. Unvaccinated health care workers born before [birth year] who lack laboratory evidence of measles, mumps, or rubella immunity or laboratory confirmation of disease should consider measles and mumps immunization with 2 doses of MMR vaccine or rubella immunization with 1 dose of MMR vaccine.  Pneumococcal 13-valent conjugate (PCV13) vaccine. When indicated, a person who is uncertain of her immunization history and has no record of immunization should receive the PCV13 vaccine. An adult aged 42 years or older who has certain medical conditions and has not been previously immunized should receive 1 dose of PCV13 vaccine. This PCV13 should be followed  with a dose of pneumococcal polysaccharide (PPSV23) vaccine. The PPSV23 vaccine dose should be obtained at least 8 weeks after the dose of PCV13 vaccine. An adult aged 4 years or older who has certain medical conditions and previously received 1 or more doses of PPSV23 vaccine should receive 1 dose of PCV13. The PCV13 vaccine dose should be obtained 1 or more years after the last PPSV23 vaccine dose.  Pneumococcal polysaccharide (PPSV23) vaccine. When PCV13 is also indicated, PCV13 should be obtained first. All adults aged 27 years and older should be immunized. An adult younger than age 33 years who has certain medical conditions should be immunized. Any person who resides in a nursing home or long-term care facility should be immunized. An adult smoker should be immunized. People with an immunocompromised condition and certain other conditions should receive both PCV13 and PPSV23 vaccines. People with human immunodeficiency virus (HIV) infection should be immunized as soon as possible after diagnosis. Immunization during chemotherapy or radiation therapy should be avoided. Routine use of PPSV23 vaccine is not recommended for American Indians, [place] Natives, or people younger than 65 years unless there are medical conditions that require PPSV23 vaccine. When indicated, people who have unknown immunization and have no record of immunization should receive PPSV23 vaccine. One-time revaccination 5 years after the first dose of PPSV23 is recommended for people aged 13 64 years who have chronic kidney failure, nephrotic syndrome, asplenia, or immunocompromised conditions. People who received 1 2 doses of PPSV23 before age 66 years should receive another dose of PPSV23 vaccine at age 27 years or later if at least 5 years have passed since the previous dose. Doses of PPSV23 are not needed for people immunized with PPSV23 at or after age 33 years.  Meningococcal vaccine. Adults with asplenia or persistent complement  component deficiencies should receive 2  doses of quadrivalent meningococcal conjugate (MenACWY-D) vaccine. The doses should be obtained at least 2 months apart. Microbiologists working with certain meningococcal bacteria, [place] recruits, people at risk during an outbreak, and people who travel to or live in countries with a high rate of meningitis should be immunized. A first-year college student up through age 49 years who is living in a residence hall should receive a dose if she did not receive a dose on or after her 16th birthday. Adults who have certain high-risk conditions should receive one or more doses of vaccine.  Hepatitis A vaccine. Adults who wish to be protected from this disease, have certain high-risk conditions, work with hepatitis A-infected animals, work in hepatitis A research labs, or travel to or work in countries with a high rate of hepatitis A should be immunized. Adults who were previously unvaccinated and who anticipate close contact with an international adoptee during the first 60 days after arrival in the [place] from a country with a high rate of hepatitis A should be immunized.  Hepatitis B vaccine. Adults who wish to be protected from this disease, have certain high-risk conditions, may be exposed to blood or other infectious body fluids, are household contacts or sex partners of hepatitis B positive people, are clients or workers in certain care facilities, or travel to or work in countries with a high rate of hepatitis B should be immunized.  Haemophilus influenzae type b (Hib) vaccine. A previously unvaccinated person with asplenia or sickle cell disease or having a scheduled splenectomy should receive 1 dose of Hib vaccine. Regardless of previous immunization, a recipient of a hematopoietic stem cell transplant should receive a 3-dose series 6 12 months after her successful transplant. Hib vaccine is not recommended for adults with HIV infection. Preventive  Services / Frequency Ages 24 to 39years  Blood pressure check.** / Every 1 to 2 years.  Lipid and cholesterol check.** / Every 5 years beginning at age 66.  Clinical breast exam.** / Every 3 years for women in their 12s and 24s.  BRCA-related cancer risk assessment.** / For women who have family members with a BRCA-related cancer (breast, ovarian, tubal, or peritoneal cancers).  Pap test.** / Every 2 years from ages 31 through 69. Every 3 years starting at age 64 through age 76 or 89 with a history of 3 consecutive normal Pap tests.  HPV screening.** / Every 3 years from ages 10 through ages 10 to 96 with a history of 3 consecutive normal Pap tests.  Hepatitis C blood test.** / For any individual with known risks for hepatitis C.  Skin self-exam. / Monthly.  Influenza vaccine. / Every year.  Tetanus, diphtheria, and acellular pertussis (Tdap, Td) vaccine.** / Consult your health care provider. Pregnant women should receive 1 dose of Tdap vaccine during each pregnancy. 1 dose of Td every 10 years.  Varicella vaccine.** / Consult your health care provider. Pregnant females who do not have evidence of immunity should receive the first dose after pregnancy.  HPV vaccine. / 3 doses over 6 months, if 90 and younger. The vaccine is not recommended for use in pregnant females. However, pregnancy testing is not needed before receiving a dose.  Measles, mumps, rubella (MMR) vaccine.** / You need at least 1 dose of MMR if you were born in 1957 or later. You may also need a 2nd dose. For females of childbearing age, rubella immunity should be determined. If there is no evidence of immunity, females who are not  pregnant should be vaccinated. If there is no evidence of immunity, females who are pregnant should delay immunization until after pregnancy.  Pneumococcal 13-valent conjugate (PCV13) vaccine.** / Consult your health care provider.  Pneumococcal polysaccharide (PPSV23) vaccine.** / 1 to 2  doses if you smoke cigarettes or if you have certain conditions.  Meningococcal vaccine.** / 1 dose if you are age 88 to 6 years and a Market researcher living in a residence hall, or have one of several medical conditions, you need to get vaccinated against meningococcal disease. You may also need additional booster doses.  Hepatitis A vaccine.** / Consult your health care provider.  Hepatitis B vaccine.** / Consult your health care provider.  Haemophilus influenzae type b (Hib) vaccine.** / Consult your health care provider. Ages 23 to 64years  Blood pressure check.** / Every 1 to 2 years.  Lipid and cholesterol check.** / Every 5 years beginning at age 20 years.  Lung cancer screening. / Every year if you are aged 51 80 years and have a 30-pack-year history of smoking and currently smoke or have quit within the past 15 years. Yearly screening is stopped once you have quit smoking for at least 15 years or develop a health problem that would prevent you from having lung cancer treatment.  Clinical breast exam.** / Every year after age 8 years.  BRCA-related cancer risk assessment.** / For women who have family members with a BRCA-related cancer (breast, ovarian, tubal, or peritoneal cancers).  Mammogram.** / Every year beginning at age 10 years and continuing for as long as you are in good health. Consult with your health care provider.  Pap test.** / Every 3 years starting at age 30 years through age 5 or 61 years with a history of 3 consecutive normal Pap tests.  HPV screening.** / Every 3 years from ages 39 years through ages 72 to 19 years with a history of 3 consecutive normal Pap tests.  Fecal occult blood test (FOBT) of stool. / Every year beginning at age 59 years and continuing until age 27 years. You may not need to do this test if you get a colonoscopy every 10 years.  Flexible sigmoidoscopy or colonoscopy.** / Every 5 years for a flexible sigmoidoscopy or every  10 years for a colonoscopy beginning at age 110 years and continuing until age 63 years.  Hepatitis C blood test.** / For all people born from [birth year] through 1963 and any individual with known risks for hepatitis C.  Skin self-exam. / Monthly.  Influenza vaccine. / Every year.  Tetanus, diphtheria, and acellular pertussis (Tdap/Td) vaccine.** / Consult your health care provider. Pregnant women should receive 1 dose of Tdap vaccine during each pregnancy. 1 dose of Td every 10 years.  Varicella vaccine.** / Consult your health care provider. Pregnant females who do not have evidence of immunity should receive the first dose after pregnancy.  Zoster vaccine.** / 1 dose for adults aged 46 years or older.  Measles, mumps, rubella (MMR) vaccine.** / You need at least 1 dose of MMR if you were born in 1955 or later. You may also need a 2nd dose. For females of childbearing age, rubella immunity should be determined. If there is no evidence of immunity, females who are not pregnant should be vaccinated. If there is no evidence of immunity, females who are pregnant should delay immunization until after pregnancy.  Pneumococcal 13-valent conjugate (PCV13) vaccine.** / Consult your health care provider.  Pneumococcal polysaccharide (PPSV23) vaccine.** / 1  to 2 doses if you smoke cigarettes or if you have certain conditions.  Meningococcal vaccine.** / Consult your health care provider.  Hepatitis A vaccine.** / Consult your health care provider.  Hepatitis B vaccine.** / Consult your health care provider.  Haemophilus influenzae type b (Hib) vaccine.** / Consult your health care provider. Ages 71 years and over  Blood pressure check.** / Every 1 to 2 years.  Lipid and cholesterol check.** / Every 5 years beginning at age 61 years.  Lung cancer screening. / Every year if you are aged 37 80 years and have a 30-pack-year history of smoking and currently smoke or have quit within the past 15 years.  Yearly screening is stopped once you have quit smoking for at least 15 years or develop a health problem that would prevent you from having lung cancer treatment.  Clinical breast exam.** / Every year after age 14 years.  BRCA-related cancer risk assessment.** / For women who have family members with a BRCA-related cancer (breast, ovarian, tubal, or peritoneal cancers).  Mammogram.** / Every year beginning at age 72 years and continuing for as long as you are in good health. Consult with your health care provider.  Pap test.** / Every 3 years starting at age 74 years through age 58 or 4 years with 3 consecutive normal Pap tests. Testing can be stopped between 65 and 70 years with 3 consecutive normal Pap tests and no abnormal Pap or HPV tests in the past 10 years.  HPV screening.** / Every 3 years from ages 51 years through ages 11 or 35 years with a history of 3 consecutive normal Pap tests. Testing can be stopped between 65 and 70 years with 3 consecutive normal Pap tests and no abnormal Pap or HPV tests in the past 10 years.  Fecal occult blood test (FOBT) of stool. / Every year beginning at age 16 years and continuing until age 20 years. You may not need to do this test if you get a colonoscopy every 10 years.  Flexible sigmoidoscopy or colonoscopy.** / Every 5 years for a flexible sigmoidoscopy or every 10 years for a colonoscopy beginning at age 24 years and continuing until age 79 years.  Hepatitis C blood test.** / For all people born from [birth year] through 1963 and any individual with known risks for hepatitis C.  Osteoporosis screening.** / A one-time screening for women ages 75 years and over and women at risk for fractures or osteoporosis.  Skin self-exam. / Monthly.  Influenza vaccine. / Every year.  Tetanus, diphtheria, and acellular pertussis (Tdap/Td) vaccine.** / 1 dose of Td every 10 years.  Varicella vaccine.** / Consult your health care provider.  Zoster vaccine.** / 1  dose for adults aged 78 years or older.  Pneumococcal 13-valent conjugate (PCV13) vaccine.** / Consult your health care provider.  Pneumococcal polysaccharide (PPSV23) vaccine.** / 1 dose for all adults aged 80 years and older.  Meningococcal vaccine.** / Consult your health care provider.  Hepatitis A vaccine.** / Consult your health care provider.  Hepatitis B vaccine.** / Consult your health care provider.  Haemophilus influenzae type b (Hib) vaccine.** / Consult your health care provider. ** Family history and personal history of risk and conditions may change your health care provider's recommendations. Document Released: 03/14/2001 Document Revised: 11/06/2012 Document Reviewed: 06/13/2010 [facility] Patient Information 2014 [place], [place]. Iron Deficiency Anemia, Adult Anemia is a condition in which there are less red blood cells or hemoglobin in the blood than normal. Hemoglobin is  this part of red blood cells that carries oxygen. Iron deficiency anemia is anemia caused by too little iron. It is the most common type of anemia. It may leave you tired and short of breath. CAUSES   Lack of iron in the diet.  Poor absorption of iron, as seen with intestinal disorders.  Intestinal bleeding.  Heavy periods. SIGNS AND SYMPTOMS  Mild anemia may not be noticeable. Symptoms may include:  Fatigue.  Headache.  Pale skin.  Weakness.  Tiredness.  Shortness of breath.  Dizziness.  Cold hands and feet.  Fast or irregular heartbeat. DIAGNOSIS  Diagnosis requires a thorough evaluation and physical exam by your health care provider. Blood tests are generally used to confirm iron deficiency anemia. Additional tests may be done to find the underlying cause of your anemia. These may include:  Testing for blood in the stool (fecal occult blood test).  A procedure to see inside the colon and rectum (colonoscopy).  A procedure to see inside the esophagus and stomach  (endoscopy). TREATMENT  Iron deficiency anemia is treated by correcting the cause of the deficiency. Treatment may involve:  Adding iron-rich foods to your diet.  Taking iron supplements. Pregnant or breastfeeding women need to take extra iron, because their normal diet usually does not provide the required amount.  Taking vitamins. Vitamin C improves the absorption of iron. Your health care provider may recommend taking your iron tablets with a glass of orange juice or vitamin C supplement.  Medicines to make heavy menstrual flow lighter.  Surgery. HOME CARE INSTRUCTIONS   Take iron as directed by your health care provider.  If you cannot tolerate taking iron supplements by mouth, talk to your health care provider about taking them through a vein (intravenously) or an injection into a muscle.  For the best iron absorption, iron supplements should be taken on an empty stomach. If you cannot tolerate them on an empty stomach, you may need to take them with food.  Do not drink milk or take antacids at the same time as your iron supplements. Milk and antacids may interfere with the absorption of iron.  Iron supplements can cause constipation. Make sure to include fiber in your diet to prevent constipation. A stool softener may also be recommended.  Take vitamins as directed by your health care provider.  Eat a diet rich in iron. Foods high in iron include liver, lean beef, whole-grain bread, eggs, dried fruit, and dark green, leafy vegetables. SEEK IMMEDIATE MEDICAL CARE IF:   You faint. If this happens, do not drive. Call your local emergency services (911 in [place] if no other help is available.  You have chest pain.  You feel nauseous or vomit.  You have severe or increased shortness of breath with activity.  You feel weak.  You have a rapid heartbeat.  You have unexplained sweating.  You become lightheaded when getting up from a chair or bed. MAKE SURE YOU:   Understand  these instructions.  Will watch your condition.  Will get help right away if you are not doing well or get worse. Document Released: 01/14/2000 Document Revised: 11/06/2012 Document Reviewed: 09/23/2012 [facility] Patient Information 2014 [place].

## 2013-05-09 NOTE — Assessment & Plan Note (Signed)
Exam done  Vaccines were reviewed and updated Labs ordered She was referred for a mammogram

## 2013-05-09 NOTE — Progress Notes (Signed)
   Subjective:    Patient ID: Penny Ramirez, female    DOB: 1970-11-11, 43 y.o.   MRN: [id number]  Hypertension This is a chronic problem. The current episode started more than 1 year ago. The problem has been gradually improving since onset. The problem is controlled. Pertinent negatives include no anxiety, blurred vision, chest pain, headaches, malaise/fatigue, neck pain, orthopnea, palpitations, peripheral edema, PND, shortness of breath or sweats. There are no associated agents to hypertension. Past treatments include angiotensin blockers and diuretics. The current treatment provides significant improvement. There are no compliance problems.       Review of Systems  Constitutional: Negative.  Negative for fever, chills, malaise/fatigue, diaphoresis, appetite change and fatigue.  HENT: Negative.   Eyes: Negative.  Negative for blurred vision.  Respiratory: Negative.  Negative for apnea, cough, choking, chest tightness, shortness of breath, wheezing and stridor.   Cardiovascular: Negative.  Negative for chest pain, palpitations, orthopnea and PND.  Gastrointestinal: Negative.  Negative for nausea, vomiting, abdominal pain, diarrhea, constipation and blood in stool.  Endocrine: Negative.   Genitourinary: Negative.   Musculoskeletal: Negative.  Negative for neck pain.  Skin: Negative.   Allergic/Immunologic: Negative.   Neurological: Negative.  Negative for headaches.  Hematological: Negative.  Negative for adenopathy. Does not bruise/bleed easily.  Psychiatric/Behavioral: Negative.        Objective:   Physical Exam  Vitals reviewed. Constitutional: She is oriented to person, place, and time. She appears well-developed and well-nourished. No distress.  HENT:  Head: Normocephalic and atraumatic.  Mouth/Throat: Oropharynx is clear and moist.  Eyes: Conjunctivae and EOM are normal. Right eye exhibits no discharge. Left eye exhibits no discharge. No scleral icterus.  Neck: Normal range  of motion. Neck supple. No JVD present. No tracheal deviation present. No thyromegaly present.  Cardiovascular: Normal rate, regular rhythm, normal heart sounds and intact distal pulses.  Exam reveals no gallop and no friction rub.   No murmur heard. Pulmonary/Chest: Effort normal and breath sounds normal. No stridor. No respiratory distress. She has no wheezes. She has no rales. Chest wall is not dull to percussion. She exhibits no mass, no tenderness, no bony tenderness, no laceration, no crepitus, no edema, no deformity, no swelling and no retraction. Right breast exhibits no inverted nipple, no mass, no nipple discharge, no skin change and no tenderness. Left breast exhibits no inverted nipple, no mass, no nipple discharge, no skin change and no tenderness. Breasts are symmetrical.  Abdominal: Soft. Bowel sounds are normal. She exhibits no distension and no mass. There is no tenderness. There is no rebound and no guarding.  Musculoskeletal: Normal range of motion. She exhibits no edema and no tenderness.  Lymphadenopathy:    She has no cervical adenopathy.  Neurological: She is oriented to person, place, and time.  Skin: Skin is warm and dry. No rash noted. She is not diaphoretic. No erythema. No pallor.  Psychiatric: She has a normal mood and affect. Her behavior is normal. Judgment and thought content normal.     Lab Results  Component Value Date   WBC 10.8* 12/28/2011   HGB 9.0* 12/28/2011   HCT 26.8* 12/28/2011   PLT 167 12/28/2011       Assessment & Plan:

## 2013-05-09 NOTE — Assessment & Plan Note (Signed)
CBC and vitamin levels are normal now

## 2013-05-09 NOTE — Assessment & Plan Note (Signed)
This is caused by the HCTZ She will start a K+ replacement

## 2013-08-05 ENCOUNTER — Encounter: Payer: Self-pay | Admitting: Internal Medicine

## 2013-08-05 MED ORDER — HYDROCHLOROTHIAZIDE 12.5 MG PO CAPS: 12.5000 mg | ORAL_CAPSULE | Freq: Every day | ORAL | Status: DC

## 2013-09-08 ENCOUNTER — Ambulatory Visit (INDEPENDENT_AMBULATORY_CARE_PROVIDER_SITE_OTHER): Payer: 59 | Admitting: Internal Medicine

## 2013-09-08 ENCOUNTER — Encounter: Payer: Self-pay | Admitting: Internal Medicine

## 2013-09-08 ENCOUNTER — Other Ambulatory Visit (INDEPENDENT_AMBULATORY_CARE_PROVIDER_SITE_OTHER): Payer: 59

## 2013-09-08 DIAGNOSIS — E876 Hypokalemia: Secondary | ICD-10-CM

## 2013-09-08 DIAGNOSIS — I10 Essential (primary) hypertension: Secondary | ICD-10-CM

## 2013-09-08 LAB — BASIC METABOLIC PANEL
BUN: 15 mg/dL (ref 6–23)
CHLORIDE: 102 meq/L (ref 96–112)
CO2: 29 mEq/L (ref 19–32)
Calcium: 9.4 mg/dL (ref 8.4–10.5)
Creatinine, Ser: 1 mg/dL (ref 0.4–1.2)
GFR: 82.28 mL/min (ref >60.00)
Glucose, Bld: 91 mg/dL (ref 70–99)
POTASSIUM: 4.1 meq/L (ref 3.5–5.1)
Sodium: 136 mEq/L (ref 135–145)

## 2013-09-08 LAB — MAGNESIUM: MAGNESIUM: 1.8 mg/dL (ref 1.5–2.5)

## 2013-09-08 MED ORDER — NALTREXONE-BUPROPION HCL ER 8-90 MG PO TB12: 2.0000 | ORAL_TABLET | Freq: Two times a day (BID) | ORAL | Status: DC

## 2013-09-08 NOTE — Patient Instructions (Signed)

## 2013-09-08 NOTE — Progress Notes (Signed)
Pre visit review using our clinic review tool, if applicable. No additional management support is needed unless otherwise documented below in the visit note. 

## 2013-09-08 NOTE — Progress Notes (Signed)
   Subjective:    Patient ID: Penny Ramirez, female    DOB: November 09, 1970, 43 y.o.   MRN: [id number]  Hypertension This is a chronic problem. The current episode started more than 1 year ago. The problem has been gradually improving since onset. The problem is controlled. Pertinent negatives include no anxiety, blurred vision, chest pain, headaches, malaise/fatigue, neck pain, orthopnea, palpitations, peripheral edema, PND, shortness of breath or sweats. There are no associated agents to hypertension. Past treatments include angiotensin blockers and diuretics. The current treatment provides moderate improvement. Compliance problems include diet and exercise.       Review of Systems  Constitutional: Positive for unexpected weight change (wt gain). Negative for fever, chills, malaise/fatigue, diaphoresis, appetite change and fatigue.  HENT: Negative.   Eyes: Negative.  Negative for blurred vision.  Respiratory: Negative.  Negative for apnea, cough, choking, chest tightness, shortness of breath, wheezing and stridor.   Cardiovascular: Negative.  Negative for chest pain, palpitations, orthopnea, leg swelling and PND.  Gastrointestinal: Negative.  Negative for nausea, vomiting, abdominal pain, diarrhea and constipation.  Endocrine: Negative.   Genitourinary: Negative.   Musculoskeletal: Negative.  Negative for arthralgias, back pain, myalgias, neck pain and neck stiffness.  Skin: Negative.  Negative for rash.  Allergic/Immunologic: Negative.   Neurological: Negative.  Negative for headaches.  Hematological: Negative.  Negative for adenopathy. Does not bruise/bleed easily.  Psychiatric/Behavioral: Negative.   All other systems reviewed and are negative.      Objective:   Physical Exam  Vitals reviewed. Constitutional: She is oriented to person, place, and time. She appears well-developed and well-nourished. No distress.  HENT:  Head: Normocephalic and atraumatic.  Mouth/Throat: Oropharynx is  clear and moist. No oropharyngeal exudate.  Eyes: Conjunctivae are normal. Right eye exhibits no discharge. Left eye exhibits no discharge. No scleral icterus.  Neck: Normal range of motion. Neck supple. No JVD present. No tracheal deviation present. No thyromegaly present.  Cardiovascular: Normal rate, regular rhythm, normal heart sounds and intact distal pulses.  Exam reveals no gallop and no friction rub.   No murmur heard. Pulmonary/Chest: Effort normal and breath sounds normal. No stridor. No respiratory distress. She has no wheezes. She has no rales. She exhibits no tenderness.  Abdominal: Soft. Bowel sounds are normal. She exhibits no distension. There is no tenderness. There is no rebound and no guarding.  Musculoskeletal: Normal range of motion. She exhibits no edema and no tenderness.  Lymphadenopathy:    She has no cervical adenopathy.  Neurological: She is oriented to person, place, and time.  Skin: Skin is warm and dry. No rash noted. She is not diaphoretic. No erythema. No pallor.  Psychiatric: She has a normal mood and affect. Her behavior is normal. Judgment and thought content normal.     Lab Results  Component Value Date   WBC 7.5 05/09/2013   HGB 13.4 05/09/2013   HCT 40.4 05/09/2013   PLT 360.0 05/09/2013   GLUCOSE 87 05/09/2013   CHOL 208* 05/09/2013   TRIG 90.0 05/09/2013   HDL 36.80* 05/09/2013   LDLCALC 153* 05/09/2013   ALT 17 05/09/2013   AST 17 05/09/2013   NA 136 05/09/2013   K 3.2* 05/09/2013   CL 100 05/09/2013   CREATININE 0.9 05/09/2013   BUN 9 05/09/2013   CO2 29 05/09/2013   TSH 1.21 05/09/2013       Assessment & Plan:

## 2013-09-10 ENCOUNTER — Encounter: Payer: Self-pay | Admitting: Internal Medicine

## 2013-09-10 NOTE — Assessment & Plan Note (Signed)
In addition to lifestyle modifications she will start contrave

## 2013-09-10 NOTE — Assessment & Plan Note (Signed)
Her BP is well controlled Her lytes and renal function are stable 

## 2013-09-10 NOTE — Assessment & Plan Note (Signed)
Her K+ level is normal now

## 2013-10-13 ENCOUNTER — Other Ambulatory Visit: Payer: Self-pay

## 2013-10-13 DIAGNOSIS — Z1231 Encounter for screening mammogram for malignant neoplasm of breast: Secondary | ICD-10-CM

## 2013-11-13 LAB — HM PAP SMEAR: HM Pap smear: NORMAL

## 2013-11-14 ENCOUNTER — Other Ambulatory Visit (HOSPITAL_COMMUNITY): Payer: Self-pay | Admitting: Obstetrics and Gynecology

## 2013-11-14 ENCOUNTER — Other Ambulatory Visit: Payer: Self-pay

## 2013-11-14 DIAGNOSIS — Z1231 Encounter for screening mammogram for malignant neoplasm of breast: Secondary | ICD-10-CM

## 2013-11-17 ENCOUNTER — Ambulatory Visit: Payer: 59

## 2013-11-18 ENCOUNTER — Ambulatory Visit (HOSPITAL_COMMUNITY)
Admission: RE | Admit: 2013-11-18 | Discharge: 2013-11-18 | Disposition: A | Discharge status: Home / Self Care | Payer: 59 | Source: Ambulatory Visit | Attending: Internal Medicine | Admitting: Internal Medicine

## 2013-11-18 DIAGNOSIS — Z1231 Encounter for screening mammogram for malignant neoplasm of breast: Secondary | ICD-10-CM | POA: Diagnosis not present

## 2013-11-25 ENCOUNTER — Other Ambulatory Visit: Payer: Self-pay | Admitting: Obstetrics and Gynecology

## 2013-11-25 ENCOUNTER — Other Ambulatory Visit (HOSPITAL_COMMUNITY)
Admission: RE | Admit: 2013-11-25 | Discharge: 2013-11-25 | Disposition: A | Discharge status: Home / Self Care | Payer: 59 | Source: Ambulatory Visit | Attending: Obstetrics and Gynecology | Admitting: Obstetrics and Gynecology

## 2013-11-25 DIAGNOSIS — Z01419 Encounter for gynecological examination (general) (routine) without abnormal findings: Secondary | ICD-10-CM | POA: Diagnosis not present

## 2013-11-25 DIAGNOSIS — Z1151 Encounter for screening for human papillomavirus (HPV): Secondary | ICD-10-CM | POA: Diagnosis present

## 2013-11-25 LAB — HM MAMMOGRAPHY: HM Mammogram: NORMAL

## 2013-11-26 LAB — CYTOLOGY - PAP

## 2013-12-01 ENCOUNTER — Encounter: Payer: Self-pay | Admitting: Internal Medicine

## 2014-05-12 ENCOUNTER — Encounter: Payer: 59 | Admitting: Internal Medicine

## 2014-05-12 DIAGNOSIS — Z0289 Encounter for other administrative examinations: Secondary | ICD-10-CM

## 2014-05-27 ENCOUNTER — Ambulatory Visit (INDEPENDENT_AMBULATORY_CARE_PROVIDER_SITE_OTHER): Payer: 59 | Admitting: Internal Medicine

## 2014-05-27 ENCOUNTER — Encounter: Payer: Self-pay | Admitting: Internal Medicine

## 2014-05-27 VITALS — BP 118/78 | HR 88 | Temp 98.2°F | Resp 16 | Ht 64.0 in | Wt 229.0 lb

## 2014-05-27 DIAGNOSIS — D509 Iron deficiency anemia, unspecified: Secondary | ICD-10-CM | POA: Diagnosis not present

## 2014-05-27 DIAGNOSIS — Z0000 Encounter for general adult medical examination without abnormal findings: Secondary | ICD-10-CM | POA: Diagnosis not present

## 2014-05-27 DIAGNOSIS — I10 Essential (primary) hypertension: Secondary | ICD-10-CM | POA: Diagnosis not present

## 2014-05-27 DIAGNOSIS — E876 Hypokalemia: Secondary | ICD-10-CM | POA: Diagnosis not present

## 2014-05-27 NOTE — Progress Notes (Signed)
Pre visit review using our clinic review tool, if applicable. No additional management support is needed unless otherwise documented below in the visit note. 

## 2014-05-27 NOTE — Patient Instructions (Signed)
Preventive Care for Adults A healthy lifestyle and preventive care can promote health and wellness. Preventive health guidelines for women include the following key practices.  A routine yearly physical is a good way to check with your health care provider about your health and preventive screening. It is a chance to share any concerns and updates on your health and to receive a thorough exam.  Visit your dentist for a routine exam and preventive care every 6 months. Brush your teeth twice a day and floss once a day. Good oral hygiene prevents tooth decay and gum disease.  The frequency of eye exams is based on your age, health, family medical history, use of contact lenses, and other factors. Follow your health care provider's recommendations for frequency of eye exams.  Eat a healthy diet. Foods like vegetables, fruits, whole grains, low-fat dairy products, and lean protein foods contain the nutrients you need without too many calories. Decrease your intake of foods high in solid fats, added sugars, and salt. Eat the right amount of calories for you.Get information about a proper diet from your health care provider, if necessary.  Regular physical exercise is one of the most important things you can do for your health. Most adults should get at least 150 minutes of moderate-intensity exercise (any activity that increases your heart rate and causes you to sweat) each week. In addition, most adults need muscle-strengthening exercises on 2 or more days a week.  Maintain a healthy weight. The body mass index (BMI) is a screening tool to identify possible weight problems. It provides an estimate of body fat based on height and weight. Your health care provider can find your BMI and can help you achieve or maintain a healthy weight.For adults 20 years and older:  A BMI below 18.5 is considered underweight.  A BMI of 18.5 to 24.9 is normal.  A BMI of 25 to 29.9 is considered overweight.  A BMI of  30 and above is considered obese.  Maintain normal blood lipids and cholesterol levels by exercising and minimizing your intake of saturated fat. Eat a balanced diet with plenty of fruit and vegetables. Blood tests for lipids and cholesterol should begin at age 76 and be repeated every 5 years. If your lipid or cholesterol levels are high, you are over 50, or you are at high risk for heart disease, you may need your cholesterol levels checked more frequently.Ongoing high lipid and cholesterol levels should be treated with medicines if diet and exercise are not working.  If you smoke, find out from your health care provider how to quit. If you do not use tobacco, do not start.  Lung cancer screening is recommended for adults aged 22-80 years who are at high risk for developing lung cancer because of a history of smoking. A yearly low-dose CT scan of the lungs is recommended for people who have at least a 30-pack-year history of smoking and are a current smoker or have quit within the past 15 years. A pack year of smoking is smoking an average of 1 pack of cigarettes a day for 1 year (for example: 1 pack a day for 30 years or 2 packs a day for 15 years). Yearly screening should continue until the smoker has stopped smoking for at least 15 years. Yearly screening should be stopped for people who develop a health problem that would prevent them from having lung cancer treatment.  If you are pregnant, do not drink alcohol. If you are breastfeeding,  be very cautious about drinking alcohol. If you are not pregnant and choose to drink alcohol, do not have more than 1 drink per day. One drink is considered to be 12 ounces (355 mL) of beer, 5 ounces (148 mL) of wine, or 1.5 ounces (44 mL) of liquor.  Avoid use of street drugs. Do not share needles with anyone. Ask for help if you need support or instructions about stopping the use of drugs.  High blood pressure causes heart disease and increases the risk of  stroke. Your blood pressure should be checked at least every 1 to 2 years. Ongoing high blood pressure should be treated with medicines if weight loss and exercise do not work.  If you are 3-86 years old, ask your health care provider if you should take aspirin to prevent strokes.  Diabetes screening involves taking a blood sample to check your fasting blood sugar level. This should be done once every 3 years, after age 67, if you are within normal weight and without risk factors for diabetes. Testing should be considered at a younger age or be carried out more frequently if you are overweight and have at least 1 risk factor for diabetes.  Breast cancer screening is essential preventive care for women. You should practice "breast self-awareness." This means understanding the normal appearance and feel of your breasts and may include breast self-examination. Any changes detected, no matter how small, should be reported to a health care provider. Women in their 8s and 30s should have a clinical breast exam (CBE) by a health care provider as part of a regular health exam every 1 to 3 years. After age 70, women should have a CBE every year. Starting at age 25, women should consider having a mammogram (breast X-ray test) every year. Women who have a family history of breast cancer should talk to their health care provider about genetic screening. Women at a high risk of breast cancer should talk to their health care providers about having an MRI and a mammogram every year.  Breast cancer gene (BRCA)-related cancer risk assessment is recommended for women who have family members with BRCA-related cancers. BRCA-related cancers include breast, ovarian, tubal, and peritoneal cancers. Having family members with these cancers may be associated with an increased risk for harmful changes (mutations) in the breast cancer genes BRCA1 and BRCA2. Results of the assessment will determine the need for genetic counseling and  BRCA1 and BRCA2 testing.  Routine pelvic exams to screen for cancer are no longer recommended for nonpregnant women who are considered low risk for cancer of the pelvic organs (ovaries, uterus, and vagina) and who do not have symptoms. Ask your health care provider if a screening pelvic exam is right for you.  If you have had past treatment for cervical cancer or a condition that could lead to cancer, you need Pap tests and screening for cancer for at least 20 years after your treatment. If Pap tests have been discontinued, your risk factors (such as having a new sexual partner) need to be reassessed to determine if screening should be resumed. Some women have medical problems that increase the chance of getting cervical cancer. In these cases, your health care provider may recommend more frequent screening and Pap tests.  The HPV test is an additional test that may be used for cervical cancer screening. The HPV test looks for the virus that can cause the cell changes on the cervix. The cells collected during the Pap test can be  tested for HPV. The HPV test could be used to screen women aged 30 years and older, and should be used in women of any age who have unclear Pap test results. After the age of 30, women should have HPV testing at the same frequency as a Pap test.  Colorectal cancer can be detected and often prevented. Most routine colorectal cancer screening begins at the age of 50 years and continues through age 75 years. However, your health care provider may recommend screening at an earlier age if you have risk factors for colon cancer. On a yearly basis, your health care provider may provide home test kits to check for hidden blood in the stool. Use of a small camera at the end of a tube, to directly examine the colon (sigmoidoscopy or colonoscopy), can detect the earliest forms of colorectal cancer. Talk to your health care provider about this at age 50, when routine screening begins. Direct  exam of the colon should be repeated every 5-10 years through age 75 years, unless early forms of pre-cancerous polyps or small growths are found.  People who are at an increased risk for hepatitis B should be screened for this virus. You are considered at high risk for hepatitis B if:  You were born in a country where hepatitis B occurs often. Talk with your health care provider about which countries are considered high risk.  Your parents were born in a high-risk country and you have not received a shot to protect against hepatitis B (hepatitis B vaccine).  You have HIV or AIDS.  You use needles to inject street drugs.  You live with, or have sex with, someone who has hepatitis B.  You get hemodialysis treatment.  You take certain medicines for conditions like cancer, organ transplantation, and autoimmune conditions.  Hepatitis C blood testing is recommended for all people born from 1945 through 1965 and any individual with known risks for hepatitis C.  Practice safe sex. Use condoms and avoid high-risk sexual practices to reduce the spread of sexually transmitted infections (STIs). STIs include gonorrhea, chlamydia, syphilis, trichomonas, herpes, HPV, and human immunodeficiency virus (HIV). Herpes, HIV, and HPV are viral illnesses that have no cure. They can result in disability, cancer, and death.  You should be screened for sexually transmitted illnesses (STIs) including gonorrhea and chlamydia if:  You are sexually active and are younger than 24 years.  You are older than 24 years and your health care provider tells you that you are at risk for this type of infection.  Your sexual activity has changed since you were last screened and you are at an increased risk for chlamydia or gonorrhea. Ask your health care provider if you are at risk.  If you are at risk of being infected with HIV, it is recommended that you take a prescription medicine daily to prevent HIV infection. This is  called preexposure prophylaxis (PrEP). You are considered at risk if:  You are a heterosexual woman, are sexually active, and are at increased risk for HIV infection.  You take drugs by injection.  You are sexually active with a partner who has HIV.  Talk with your health care provider about whether you are at high risk of being infected with HIV. If you choose to begin PrEP, you should first be tested for HIV. You should then be tested every 3 months for as long as you are taking PrEP.  Osteoporosis is a disease in which the bones lose minerals and strength   with aging. This can result in serious bone fractures or breaks. The risk of osteoporosis can be identified using a bone density scan. Women ages 65 years and over and women at risk for fractures or osteoporosis should discuss screening with their health care providers. Ask your health care provider whether you should take a calcium supplement or vitamin D to reduce the rate of osteoporosis.  Menopause can be associated with physical symptoms and risks. Hormone replacement therapy is available to decrease symptoms and risks. You should talk to your health care provider about whether hormone replacement therapy is right for you.  Use sunscreen. Apply sunscreen liberally and repeatedly throughout the day. You should seek shade when your shadow is shorter than you. Protect yourself by wearing long sleeves, pants, a wide-brimmed hat, and sunglasses year round, whenever you are outdoors.  Once a month, do a whole body skin exam, using a mirror to look at the skin on your back. Tell your health care provider of new moles, moles that have irregular borders, moles that are larger than a pencil eraser, or moles that have changed in shape or color.  Stay current with required vaccines (immunizations).  Influenza vaccine. All adults should be immunized every year.  Tetanus, diphtheria, and acellular pertussis (Td, Tdap) vaccine. Pregnant women should  receive 1 dose of Tdap vaccine during each pregnancy. The dose should be obtained regardless of the length of time since the last dose. Immunization is preferred during the 27th-36th week of gestation. An adult who has not previously received Tdap or who does not know her vaccine status should receive 1 dose of Tdap. This initial dose should be followed by tetanus and diphtheria toxoids (Td) booster doses every 10 years. Adults with an unknown or incomplete history of completing a 3-dose immunization series with Td-containing vaccines should begin or complete a primary immunization series including a Tdap dose. Adults should receive a Td booster every 10 years.  Varicella vaccine. An adult without evidence of immunity to varicella should receive 2 doses or a second dose if she has previously received 1 dose. Pregnant females who do not have evidence of immunity should receive the first dose after pregnancy. This first dose should be obtained before leaving the health care facility. The second dose should be obtained 4-8 weeks after the first dose.  Human papillomavirus (HPV) vaccine. Females aged 13-26 years who have not received the vaccine previously should obtain the 3-dose series. The vaccine is not recommended for use in pregnant females. However, pregnancy testing is not needed before receiving a dose. If a female is found to be pregnant after receiving a dose, no treatment is needed. In that case, the remaining doses should be delayed until after the pregnancy. Immunization is recommended for any person with an immunocompromised condition through the age of 26 years if she did not get any or all doses earlier. During the 3-dose series, the second dose should be obtained 4-8 weeks after the first dose. The third dose should be obtained 24 weeks after the first dose and 16 weeks after the second dose.  Zoster vaccine. One dose is recommended for adults aged 60 years or older unless certain conditions are  present.  Measles, mumps, and rubella (MMR) vaccine. Adults born before 1957 generally are considered immune to measles and mumps. Adults born in 1957 or later should have 1 or more doses of MMR vaccine unless there is a contraindication to the vaccine or there is laboratory evidence of immunity to   each of the three diseases. A routine second dose of MMR vaccine should be obtained at least 28 days after the first dose for students attending postsecondary schools, health care workers, or international travelers. People who received inactivated measles vaccine or an unknown type of measles vaccine during 1963-1967 should receive 2 doses of MMR vaccine. People who received inactivated mumps vaccine or an unknown type of mumps vaccine before 1979 and are at high risk for mumps infection should consider immunization with 2 doses of MMR vaccine. For females of childbearing age, rubella immunity should be determined. If there is no evidence of immunity, females who are not pregnant should be vaccinated. If there is no evidence of immunity, females who are pregnant should delay immunization until after pregnancy. Unvaccinated health care workers born before 1957 who lack laboratory evidence of measles, mumps, or rubella immunity or laboratory confirmation of disease should consider measles and mumps immunization with 2 doses of MMR vaccine or rubella immunization with 1 dose of MMR vaccine.  Pneumococcal 13-valent conjugate (PCV13) vaccine. When indicated, a person who is uncertain of her immunization history and has no record of immunization should receive the PCV13 vaccine. An adult aged 19 years or older who has certain medical conditions and has not been previously immunized should receive 1 dose of PCV13 vaccine. This PCV13 should be followed with a dose of pneumococcal polysaccharide (PPSV23) vaccine. The PPSV23 vaccine dose should be obtained at least 8 weeks after the dose of PCV13 vaccine. An adult aged 19  years or older who has certain medical conditions and previously received 1 or more doses of PPSV23 vaccine should receive 1 dose of PCV13. The PCV13 vaccine dose should be obtained 1 or more years after the last PPSV23 vaccine dose.  Pneumococcal polysaccharide (PPSV23) vaccine. When PCV13 is also indicated, PCV13 should be obtained first. All adults aged 65 years and older should be immunized. An adult younger than age 65 years who has certain medical conditions should be immunized. Any person who resides in a nursing home or long-term care facility should be immunized. An adult smoker should be immunized. People with an immunocompromised condition and certain other conditions should receive both PCV13 and PPSV23 vaccines. People with human immunodeficiency virus (HIV) infection should be immunized as soon as possible after diagnosis. Immunization during chemotherapy or radiation therapy should be avoided. Routine use of PPSV23 vaccine is not recommended for American Indians, [place] Natives, or people younger than 65 years unless there are medical conditions that require PPSV23 vaccine. When indicated, people who have unknown immunization and have no record of immunization should receive PPSV23 vaccine. One-time revaccination 5 years after the first dose of PPSV23 is recommended for people aged 19-64 years who have chronic kidney failure, nephrotic syndrome, asplenia, or immunocompromised conditions. People who received 1-2 doses of PPSV23 before age 65 years should receive another dose of PPSV23 vaccine at age 65 years or later if at least 5 years have passed since the previous dose. Doses of PPSV23 are not needed for people immunized with PPSV23 at or after age 65 years.  Meningococcal vaccine. Adults with asplenia or persistent complement component deficiencies should receive 2 doses of quadrivalent meningococcal conjugate (MenACWY-D) vaccine. The doses should be obtained at least 2 months apart.  Microbiologists working with certain meningococcal bacteria, military recruits, people at risk during an outbreak, and people who travel to or live in countries with a high rate of meningitis should be immunized. A first-year college student up through age   21 years who is living in a residence hall should receive a dose if she did not receive a dose on or after her 16th birthday. Adults who have certain high-risk conditions should receive one or more doses of vaccine.  Hepatitis A vaccine. Adults who wish to be protected from this disease, have certain high-risk conditions, work with hepatitis A-infected animals, work in hepatitis A research labs, or travel to or work in countries with a high rate of hepatitis A should be immunized. Adults who were previously unvaccinated and who anticipate close contact with an international adoptee during the first 60 days after arrival in the [place] from a country with a high rate of hepatitis A should be immunized.  Hepatitis B vaccine. Adults who wish to be protected from this disease, have certain high-risk conditions, may be exposed to blood or other infectious body fluids, are household contacts or sex partners of hepatitis B positive people, are clients or workers in certain care facilities, or travel to or work in countries with a high rate of hepatitis B should be immunized.  Haemophilus influenzae type b (Hib) vaccine. A previously unvaccinated person with asplenia or sickle cell disease or having a scheduled splenectomy should receive 1 dose of Hib vaccine. Regardless of previous immunization, a recipient of a hematopoietic stem cell transplant should receive a 3-dose series 6-12 months after her successful transplant. Hib vaccine is not recommended for adults with HIV infection. Preventive Services / Frequency Ages 64 to 68 years  Blood pressure check.** / Every 1 to 2 years.  Lipid and cholesterol check.** / Every 5 years beginning at age  22.  Clinical breast exam.** / Every 3 years for women in their 88s and 53s.  BRCA-related cancer risk assessment.** / For women who have family members with a BRCA-related cancer (breast, ovarian, tubal, or peritoneal cancers).  Pap test.** / Every 2 years from ages 90 through 51. Every 3 years starting at age 21 through age 56 or 3 with a history of 3 consecutive normal Pap tests.  HPV screening.** / Every 3 years from ages 24 through ages 1 to 46 with a history of 3 consecutive normal Pap tests.  Hepatitis C blood test.** / For any individual with known risks for hepatitis C.  Skin self-exam. / Monthly.  Influenza vaccine. / Every year.  Tetanus, diphtheria, and acellular pertussis (Tdap, Td) vaccine.** / Consult your health care provider. Pregnant women should receive 1 dose of Tdap vaccine during each pregnancy. 1 dose of Td every 10 years.  Varicella vaccine.** / Consult your health care provider. Pregnant females who do not have evidence of immunity should receive the first dose after pregnancy.  HPV vaccine. / 3 doses over 6 months, if 72 and younger. The vaccine is not recommended for use in pregnant females. However, pregnancy testing is not needed before receiving a dose.  Measles, mumps, rubella (MMR) vaccine.** / You need at least 1 dose of MMR if you were born in 1957 or later. You may also need a 2nd dose. For females of childbearing age, rubella immunity should be determined. If there is no evidence of immunity, females who are not pregnant should be vaccinated. If there is no evidence of immunity, females who are pregnant should delay immunization until after pregnancy.  Pneumococcal 13-valent conjugate (PCV13) vaccine.** / Consult your health care provider.  Pneumococcal polysaccharide (PPSV23) vaccine.** / 1 to 2 doses if you smoke cigarettes or if you have certain conditions.  Meningococcal vaccine.** /  1 dose if you are age 19 to 21 years and a first-year college  student living in a residence hall, or have one of several medical conditions, you need to get vaccinated against meningococcal disease. You may also need additional booster doses.  Hepatitis A vaccine.** / Consult your health care provider.  Hepatitis B vaccine.** / Consult your health care provider.  Haemophilus influenzae type b (Hib) vaccine.** / Consult your health care provider. Ages 40 to 64 years  Blood pressure check.** / Every 1 to 2 years.  Lipid and cholesterol check.** / Every 5 years beginning at age 20 years.  Lung cancer screening. / Every year if you are aged 55-80 years and have a 30-pack-year history of smoking and currently smoke or have quit within the past 15 years. Yearly screening is stopped once you have quit smoking for at least 15 years or develop a health problem that would prevent you from having lung cancer treatment.  Clinical breast exam.** / Every year after age 40 years.  BRCA-related cancer risk assessment.** / For women who have family members with a BRCA-related cancer (breast, ovarian, tubal, or peritoneal cancers).  Mammogram.** / Every year beginning at age 40 years and continuing for as long as you are in good health. Consult with your health care provider.  Pap test.** / Every 3 years starting at age 30 years through age 65 or 70 years with a history of 3 consecutive normal Pap tests.  HPV screening.** / Every 3 years from ages 30 years through ages 65 to 70 years with a history of 3 consecutive normal Pap tests.  Fecal occult blood test (FOBT) of stool. / Every year beginning at age 50 years and continuing until age 75 years. You may not need to do this test if you get a colonoscopy every 10 years.  Flexible sigmoidoscopy or colonoscopy.** / Every 5 years for a flexible sigmoidoscopy or every 10 years for a colonoscopy beginning at age 50 years and continuing until age 75 years.  Hepatitis C blood test.** / For all people born from 1945 through  1965 and any individual with known risks for hepatitis C.  Skin self-exam. / Monthly.  Influenza vaccine. / Every year.  Tetanus, diphtheria, and acellular pertussis (Tdap/Td) vaccine.** / Consult your health care provider. Pregnant women should receive 1 dose of Tdap vaccine during each pregnancy. 1 dose of Td every 10 years.  Varicella vaccine.** / Consult your health care provider. Pregnant females who do not have evidence of immunity should receive the first dose after pregnancy.  Zoster vaccine.** / 1 dose for adults aged 60 years or older.  Measles, mumps, rubella (MMR) vaccine.** / You need at least 1 dose of MMR if you were born in 1957 or later. You may also need a 2nd dose. For females of childbearing age, rubella immunity should be determined. If there is no evidence of immunity, females who are not pregnant should be vaccinated. If there is no evidence of immunity, females who are pregnant should delay immunization until after pregnancy.  Pneumococcal 13-valent conjugate (PCV13) vaccine.** / Consult your health care provider.  Pneumococcal polysaccharide (PPSV23) vaccine.** / 1 to 2 doses if you smoke cigarettes or if you have certain conditions.  Meningococcal vaccine.** / Consult your health care provider.  Hepatitis A vaccine.** / Consult your health care provider.  Hepatitis B vaccine.** / Consult your health care provider.  Haemophilus influenzae type b (Hib) vaccine.** / Consult your health care provider. Ages 65   years and over  Blood pressure check.** / Every 1 to 2 years.  Lipid and cholesterol check.** / Every 5 years beginning at age 22 years.  Lung cancer screening. / Every year if you are aged 73-80 years and have a 30-pack-year history of smoking and currently smoke or have quit within the past 15 years. Yearly screening is stopped once you have quit smoking for at least 15 years or develop a health problem that would prevent you from having lung cancer  treatment.  Clinical breast exam.** / Every year after age 4 years.  BRCA-related cancer risk assessment.** / For women who have family members with a BRCA-related cancer (breast, ovarian, tubal, or peritoneal cancers).  Mammogram.** / Every year beginning at age 40 years and continuing for as long as you are in good health. Consult with your health care provider.  Pap test.** / Every 3 years starting at age 9 years through age 34 or 91 years with 3 consecutive normal Pap tests. Testing can be stopped between 65 and 70 years with 3 consecutive normal Pap tests and no abnormal Pap or HPV tests in the past 10 years.  HPV screening.** / Every 3 years from ages 57 years through ages 64 or 45 years with a history of 3 consecutive normal Pap tests. Testing can be stopped between 65 and 70 years with 3 consecutive normal Pap tests and no abnormal Pap or HPV tests in the past 10 years.  Fecal occult blood test (FOBT) of stool. / Every year beginning at age 15 years and continuing until age 17 years. You may not need to do this test if you get a colonoscopy every 10 years.  Flexible sigmoidoscopy or colonoscopy.** / Every 5 years for a flexible sigmoidoscopy or every 10 years for a colonoscopy beginning at age 86 years and continuing until age 71 years.  Hepatitis C blood test.** / For all people born from [birth year] through 1963 and any individual with known risks for hepatitis C.  Osteoporosis screening.** / A one-time screening for women ages 83 years and over and women at risk for fractures or osteoporosis.  Skin self-exam. / Monthly.  Influenza vaccine. / Every year.  Tetanus, diphtheria, and acellular pertussis (Tdap/Td) vaccine.** / 1 dose of Td every 10 years.  Varicella vaccine.** / Consult your health care provider.  Zoster vaccine.** / 1 dose for adults aged 61 years or older.  Pneumococcal 13-valent conjugate (PCV13) vaccine.** / Consult your health care provider.  Pneumococcal  polysaccharide (PPSV23) vaccine.** / 1 dose for all adults aged 28 years and older.  Meningococcal vaccine.** / Consult your health care provider.  Hepatitis A vaccine.** / Consult your health care provider.  Hepatitis B vaccine.** / Consult your health care provider.  Haemophilus influenzae type b (Hib) vaccine.** / Consult your health care provider. ** Family history and personal history of risk and conditions may change your health care provider's recommendations. Document Released: 03/14/2001 Document Revised: 06/02/2013 Document Reviewed: 06/13/2010 [facility] Patient Information 2015 [place], [place]. This information is not intended to replace advice given to you by your health care provider. Make sure you discuss any questions you have with your health care provider.

## 2014-05-28 ENCOUNTER — Encounter: Payer: Self-pay | Admitting: Internal Medicine

## 2014-05-28 ENCOUNTER — Other Ambulatory Visit (INDEPENDENT_AMBULATORY_CARE_PROVIDER_SITE_OTHER): Payer: 59

## 2014-05-28 DIAGNOSIS — Z0000 Encounter for general adult medical examination without abnormal findings: Secondary | ICD-10-CM | POA: Diagnosis not present

## 2014-05-28 LAB — CBC WITH DIFFERENTIAL/PLATELET
BASOS PCT: 0.4 % (ref 0.0–3.0)
Basophils Absolute: 0 10*3/uL (ref 0.0–0.1)
Eosinophils Absolute: 0.5 10*3/uL (ref 0.0–0.7)
Eosinophils Relative: 6.4 % — ABNORMAL HIGH (ref 0.0–5.0)
HEMATOCRIT: 38.1 % (ref 36.0–46.0)
Hemoglobin: 12.6 g/dL (ref 12.0–15.0)
LYMPHS PCT: 28.1 % (ref 12.0–46.0)
Lymphs Abs: 2.2 10*3/uL (ref 0.7–4.0)
MCHC: 33.2 g/dL (ref 30.0–36.0)
MCV: 90.3 fl (ref 78.0–100.0)
MONOS PCT: 7.9 % (ref 3.0–12.0)
Monocytes Absolute: 0.6 10*3/uL (ref 0.1–1.0)
NEUTROS PCT: 57.2 % (ref 43.0–77.0)
Neutro Abs: 4.5 10*3/uL (ref 1.4–7.7)
Platelets: 318 10*3/uL (ref 150.0–400.0)
RBC: 4.22 Mil/uL (ref 3.87–5.11)
RDW: 13.6 % (ref 11.5–15.5)
WBC: 7.9 10*3/uL (ref 4.0–10.5)

## 2014-05-28 LAB — TSH: TSH: 0.87 u[IU]/mL (ref 0.35–4.50)

## 2014-05-28 LAB — COMPREHENSIVE METABOLIC PANEL
ALT: 13 U/L (ref 0–35)
AST: 15 U/L (ref 0–37)
Albumin: 3.7 g/dL (ref 3.5–5.2)
Alkaline Phosphatase: 74 U/L (ref 39–117)
BUN: 9 mg/dL (ref 6–23)
CALCIUM: 9 mg/dL (ref 8.4–10.5)
CHLORIDE: 104 meq/L (ref 96–112)
CO2: 27 meq/L (ref 19–32)
CREATININE: 0.93 mg/dL (ref 0.40–1.20)
GFR: 85.05 mL/min (ref >60.00)
GLUCOSE: 95 mg/dL (ref 70–99)
Potassium: 3.8 mEq/L (ref 3.5–5.1)
Sodium: 138 mEq/L (ref 135–145)
Total Bilirubin: 0.4 mg/dL (ref 0.2–1.2)
Total Protein: 6.5 g/dL (ref 6.0–8.3)

## 2014-05-28 LAB — LIPID PANEL
CHOL/HDL RATIO: 4
Cholesterol: 166 mg/dL (ref 0–200)
HDL: 39.5 mg/dL (ref >39.00)
LDL Cholesterol: 115 mg/dL — ABNORMAL HIGH (ref 0–99)
NONHDL: 126.5
Triglycerides: 59 mg/dL (ref 0.0–149.0)
VLDL: 11.8 mg/dL (ref 0.0–40.0)

## 2014-05-28 NOTE — Progress Notes (Signed)
   Subjective:    Patient ID: Penny Ramirez, female    DOB: May 04, 1970, 44 y.o.   MRN: [id number]  Hypertension This is a chronic problem. The current episode started more than 1 year ago. The problem has been gradually improving since onset. The problem is controlled. Pertinent negatives include no anxiety, blurred vision, chest pain, headaches, malaise/fatigue, neck pain, orthopnea, palpitations, peripheral edema, PND, shortness of breath or sweats. There are no associated agents to hypertension. Past treatments include diuretics and angiotensin blockers. The current treatment provides significant improvement. Compliance problems include diet and exercise.       Review of Systems  Constitutional: Negative.  Negative for fever, chills, malaise/fatigue, diaphoresis, appetite change and fatigue.  HENT: Negative.  Negative for trouble swallowing.   Eyes: Negative.  Negative for blurred vision.  Respiratory: Negative.  Negative for cough, choking, chest tightness, shortness of breath and stridor.   Cardiovascular: Negative.  Negative for chest pain, palpitations, orthopnea, leg swelling and PND.  Gastrointestinal: Negative.  Negative for nausea, abdominal pain, diarrhea, constipation and blood in stool.  Endocrine: Negative.   Genitourinary: Negative.   Musculoskeletal: Negative.  Negative for back pain and neck pain.  Skin: Negative.  Negative for rash.  Allergic/Immunologic: Negative.   Neurological: Negative.  Negative for headaches.  Hematological: Negative.  Negative for adenopathy. Does not bruise/bleed easily.  Psychiatric/Behavioral: Negative.        Objective:   Physical Exam  Constitutional: She is oriented to person, place, and time. She appears well-developed and well-nourished. No distress.  HENT:  Head: Normocephalic and atraumatic.  Mouth/Throat: Oropharynx is clear and moist. No oropharyngeal exudate.  Eyes: Conjunctivae are normal. Right eye exhibits no discharge. Left  eye exhibits no discharge. No scleral icterus.  Neck: Normal range of motion. Neck supple. No JVD present. No tracheal deviation present. No thyromegaly present.  Cardiovascular: Normal rate, regular rhythm, normal heart sounds and intact distal pulses.  Exam reveals no gallop and no friction rub.   No murmur heard. Pulmonary/Chest: Effort normal and breath sounds normal. No stridor. No respiratory distress. She has no wheezes. She has no rales. She exhibits no tenderness.  Abdominal: Soft. Bowel sounds are normal. She exhibits no distension and no mass. There is no tenderness. There is no rebound and no guarding.  Musculoskeletal: Normal range of motion. She exhibits no edema or tenderness.  Lymphadenopathy:    She has no cervical adenopathy.  Neurological: She is oriented to person, place, and time.  Skin: Skin is warm and dry. No rash noted. She is not diaphoretic. No erythema. No pallor.  Psychiatric: She has a normal mood and affect. Her behavior is normal. Judgment and thought content normal.  Vitals reviewed.    Lab Results  Component Value Date   WBC 7.5 05/09/2013   HGB 13.4 05/09/2013   HCT 40.4 05/09/2013   PLT 360.0 05/09/2013   GLUCOSE 91 09/08/2013   CHOL 208* 05/09/2013   TRIG 90.0 05/09/2013   HDL 36.80* 05/09/2013   LDLCALC 153* 05/09/2013   ALT 17 05/09/2013   AST 17 05/09/2013   NA 136 09/08/2013   K 4.1 09/08/2013   CL 102 09/08/2013   CREATININE 1.0 09/08/2013   BUN 15 09/08/2013   CO2 29 09/08/2013   TSH 1.21 05/09/2013       Assessment & Plan:

## 2014-05-28 NOTE — Assessment & Plan Note (Signed)
I will recheck her CBC today and will address this if indicated

## 2014-05-28 NOTE — Assessment & Plan Note (Signed)
PAP and mammo are UTD Vaccines were reviewed Exam done Labs ordered  Pt ed material was given

## 2014-05-28 NOTE — Assessment & Plan Note (Signed)
Her BP is well controlled I will monitor her lytes and renal fucntion

## 2014-05-28 NOTE — Assessment & Plan Note (Signed)
This is caused bu the HCTZ Will recheck today

## 2014-07-15 ENCOUNTER — Ambulatory Visit (INDEPENDENT_AMBULATORY_CARE_PROVIDER_SITE_OTHER): Payer: 59 | Admitting: Internal Medicine

## 2014-07-15 ENCOUNTER — Encounter: Payer: Self-pay | Admitting: Internal Medicine

## 2014-07-15 VITALS — BP 112/80 | HR 68 | Temp 97.9°F | Resp 16 | Wt 226.0 lb

## 2014-07-15 DIAGNOSIS — R300 Dysuria: Secondary | ICD-10-CM | POA: Diagnosis not present

## 2014-07-15 DIAGNOSIS — Z87442 Personal history of urinary calculi: Secondary | ICD-10-CM | POA: Diagnosis not present

## 2014-07-15 MED ORDER — NITROFURANTOIN MONOHYD MACRO 100 MG PO CAPS: 100.0000 mg | ORAL_CAPSULE | Freq: Two times a day (BID) | ORAL | Status: DC

## 2014-07-15 MED ORDER — PHENAZOPYRIDINE HCL 200 MG PO TABS: 200.0000 mg | ORAL_TABLET | Freq: Three times a day (TID) | ORAL | Status: DC | PRN

## 2014-07-15 NOTE — Progress Notes (Signed)
   Subjective:    Patient ID: Penny Ramirez, female    DOB: [birth year], 44 y.o.   MRN: [id number]  HPI  Her symptoms began yesterday morning upon awakening as cloudy urine associated with dysuria and suprapubic pressure. There was some improvement after ingesting apple cider vinegar.  She has a history of frequent urinary tract infections. E coli documented 2/15.  She saw Dr. Jeffie Pollock for renal calculi and had lithotripsy in 2007.  Review of Systems  She denies fever, chills, or sweats. She has no associated flank pain, pyuria, or hematuria. She's had no vaginal discharge      Objective:   Physical Exam  Pertinent or positive findings include: Minor crepitus of the knees. General appearance :adequately nourished; in no distress. BMI: 38.77 Eyes: No conjunctival inflammation or scleral icterus is present.  Oral exam:  Lips and gums are healthy appearing.There is no oropharyngeal erythema or exudate noted. Dental hygiene is good.  Heart:  Normal rate and regular rhythm. S1 and S2 normal without gallop, murmur, click, rub or other extra sounds    Lungs:Chest clear to auscultation; no wheezes, rhonchi,rales ,or rubs present.No increased work of breathing.   Abdomen: bowel sounds normal, soft and non-tender without masses, organomegaly or hernias noted.  No guarding or rebound. No flank tenderness to percussion.  Vascular : all pulses equal ; no bruits present.  Skin:Warm & dry.  Intact without suspicious lesions or rashes ; no tenting    Lymphatic: No lymphadenopathy is noted about the head, neck, axilla, or inguinal areas.   Neuro: Strength, tone          Assessment & Plan:  #1 dysuria, probable urinary tract infection  Plan: As per standard of care she'll be placed on nitrofurantoin pending results of the culture and sensitivity.

## 2014-07-15 NOTE — Patient Instructions (Signed)
Drink as much nondairy fluids as possible. Avoid spicy foods or alcohol as  these may aggravate the bladder. Do not take decongestants. Avoid narcotics if possible.

## 2014-07-15 NOTE — Progress Notes (Signed)
Pre visit review using our clinic review tool, if applicable. No additional management support is needed unless otherwise documented below in the visit note. 

## 2014-07-27 ENCOUNTER — Encounter: Payer: Self-pay | Admitting: Internal Medicine

## 2014-07-27 ENCOUNTER — Other Ambulatory Visit: Payer: Self-pay | Admitting: Internal Medicine

## 2014-07-27 MED ORDER — LOSARTAN POTASSIUM 100 MG PO TABS: 100.0000 mg | ORAL_TABLET | Freq: Every day | ORAL | Status: DC

## 2014-07-27 MED ORDER — HYDROCHLOROTHIAZIDE 12.5 MG PO CAPS: 12.5000 mg | ORAL_CAPSULE | Freq: Every day | ORAL | Status: DC

## 2014-07-27 NOTE — Telephone Encounter (Signed)
erx done

## 2014-10-22 ENCOUNTER — Other Ambulatory Visit: Payer: Self-pay

## 2014-10-22 DIAGNOSIS — Z1231 Encounter for screening mammogram for malignant neoplasm of breast: Secondary | ICD-10-CM

## 2014-11-13 LAB — HM MAMMOGRAPHY

## 2014-11-23 ENCOUNTER — Ambulatory Visit
Admission: RE | Admit: 2014-11-23 | Discharge: 2014-11-23 | Disposition: A | Discharge status: Home / Self Care | Payer: 59 | Source: Ambulatory Visit

## 2014-11-23 DIAGNOSIS — Z1231 Encounter for screening mammogram for malignant neoplasm of breast: Secondary | ICD-10-CM

## 2014-11-30 ENCOUNTER — Other Ambulatory Visit: Payer: Self-pay | Admitting: Obstetrics and Gynecology

## 2014-11-30 ENCOUNTER — Other Ambulatory Visit (HOSPITAL_COMMUNITY)
Admission: RE | Admit: 2014-11-30 | Discharge: 2014-11-30 | Disposition: A | Discharge status: Home / Self Care | Payer: 59 | Source: Ambulatory Visit | Attending: Obstetrics and Gynecology | Admitting: Obstetrics and Gynecology

## 2014-11-30 DIAGNOSIS — Z01419 Encounter for gynecological examination (general) (routine) without abnormal findings: Secondary | ICD-10-CM | POA: Insufficient documentation

## 2014-11-30 LAB — HM PAP SMEAR

## 2014-12-03 LAB — CYTOLOGY - PAP

## 2015-03-04 ENCOUNTER — Other Ambulatory Visit: Payer: Self-pay | Admitting: Internal Medicine

## 2015-03-04 MED FILL — HYDROCHLOROTHIAZIDE 12.5 MG: 12.5 | 90 days supply | Qty: 90 | Fill #0

## 2015-03-04 MED FILL — LOSARTAN POTASSIUM 100 MG T: 100 | 90 days supply | Qty: 90 | Fill #2

## 2015-06-03 ENCOUNTER — Ambulatory Visit (INDEPENDENT_AMBULATORY_CARE_PROVIDER_SITE_OTHER): Payer: 59 | Admitting: Internal Medicine

## 2015-06-03 ENCOUNTER — Encounter: Payer: Self-pay | Admitting: Internal Medicine

## 2015-06-03 ENCOUNTER — Other Ambulatory Visit (INDEPENDENT_AMBULATORY_CARE_PROVIDER_SITE_OTHER): Payer: 59

## 2015-06-03 VITALS — BP 120/88 | HR 74 | Temp 98.3°F | Ht 64.0 in | Wt 231.0 lb

## 2015-06-03 DIAGNOSIS — I10 Essential (primary) hypertension: Secondary | ICD-10-CM | POA: Diagnosis not present

## 2015-06-03 DIAGNOSIS — Z0000 Encounter for general adult medical examination without abnormal findings: Secondary | ICD-10-CM

## 2015-06-03 DIAGNOSIS — Z87442 Personal history of urinary calculi: Secondary | ICD-10-CM

## 2015-06-03 LAB — LIPID PANEL
CHOLESTEROL: 193 mg/dL (ref 0–200)
HDL: 40.2 mg/dL (ref >39.00)
LDL Cholesterol: 133 mg/dL — ABNORMAL HIGH (ref 0–99)
NonHDL: 152.73
TRIGLYCERIDES: 100 mg/dL (ref 0.0–149.0)
Total CHOL/HDL Ratio: 5
VLDL: 20 mg/dL (ref 0.0–40.0)

## 2015-06-03 LAB — COMPREHENSIVE METABOLIC PANEL
ALBUMIN: 4.1 g/dL (ref 3.5–5.2)
ALT: 16 U/L (ref 0–35)
AST: 15 U/L (ref 0–37)
Alkaline Phosphatase: 74 U/L (ref 39–117)
BILIRUBIN TOTAL: 0.4 mg/dL (ref 0.2–1.2)
BUN: 9 mg/dL (ref 6–23)
CALCIUM: 9.4 mg/dL (ref 8.4–10.5)
CO2: 29 mEq/L (ref 19–32)
CREATININE: 0.92 mg/dL (ref 0.40–1.20)
Chloride: 102 mEq/L (ref 96–112)
GFR: 85.7 mL/min (ref >60.00)
Glucose, Bld: 89 mg/dL (ref 70–99)
Potassium: 3.5 mEq/L (ref 3.5–5.1)
Sodium: 139 mEq/L (ref 135–145)
Total Protein: 7.2 g/dL (ref 6.0–8.3)

## 2015-06-03 LAB — CBC WITH DIFFERENTIAL/PLATELET
BASOS ABS: 0 10*3/uL (ref 0.0–0.1)
Basophils Relative: 0.5 % (ref 0.0–3.0)
EOS ABS: 0.5 10*3/uL (ref 0.0–0.7)
Eosinophils Relative: 5.7 % — ABNORMAL HIGH (ref 0.0–5.0)
HEMATOCRIT: 37.7 % (ref 36.0–46.0)
HEMOGLOBIN: 12.4 g/dL (ref 12.0–15.0)
LYMPHS PCT: 34.5 % (ref 12.0–46.0)
Lymphs Abs: 3.1 10*3/uL (ref 0.7–4.0)
MCHC: 32.9 g/dL (ref 30.0–36.0)
MCV: 90.3 fl (ref 78.0–100.0)
MONO ABS: 0.7 10*3/uL (ref 0.1–1.0)
Monocytes Relative: 7.5 % (ref 3.0–12.0)
Neutro Abs: 4.7 10*3/uL (ref 1.4–7.7)
Neutrophils Relative %: 51.8 % (ref 43.0–77.0)
PLATELETS: 334 10*3/uL (ref 150.0–400.0)
RBC: 4.17 Mil/uL (ref 3.87–5.11)
RDW: 13.8 % (ref 11.5–15.5)
WBC: 9.1 10*3/uL (ref 4.0–10.5)

## 2015-06-03 LAB — TSH: TSH: 1.2 u[IU]/mL (ref 0.35–4.50)

## 2015-06-03 MED ORDER — LOSARTAN POTASSIUM 100 MG PO TABS: 100.0000 mg | ORAL_TABLET | Freq: Every day | ORAL | Status: DC

## 2015-06-03 MED ORDER — HYDROCHLOROTHIAZIDE 12.5 MG PO CAPS: 12.5000 mg | ORAL_CAPSULE | Freq: Every day | ORAL | Status: DC

## 2015-06-03 MED FILL — HYDROCHLOROTHIAZIDE 12.5 MG: 12.5 | 90 days supply | Qty: 90 | Fill #0

## 2015-06-03 MED FILL — LOSARTAN POTASSIUM 100 MG T: 100 | 90 days supply | Qty: 90 | Fill #0

## 2015-06-03 NOTE — Progress Notes (Signed)
Subjective:  Patient ID: Penny Ramirez, female    DOB: 09-17-1970  Age: 45 y.o. MRN: [id number]  CC: Hypertension and Annual Exam   HPI SONIKA LEVINS presents for a CPX - She feels well and offers no complaints. She tells me her blood pressure has been well controlled on hydrochlorothiazide and losartan.  Outpatient Prescriptions Prior to Visit  Medication Sig Dispense Refill  . Multiple Vitamin (MULTIVITAMIN) capsule Take 1 capsule by mouth daily.    . hydrochlorothiazide (MICROZIDE) 12.5 MG capsule TAKE 1 CAPSULE BY MOUTH DAILY. 90 capsule 0  . losartan (COZAAR) 100 MG tablet Take 1 tablet (100 mg total) by mouth daily. 90 tablet 3  . hydrochlorothiazide (MICROZIDE) 12.5 MG capsule Take 1 capsule (12.5 mg total) by mouth daily. 90 capsule 3  . nitrofurantoin, macrocrystal-monohydrate, (MACROBID) 100 MG capsule Take 1 capsule (100 mg total) by mouth 2 (two) times daily. 20 capsule 0  . phenazopyridine (PYRIDIUM) 200 MG tablet Take 1 tablet (200 mg total) by mouth 3 (three) times daily as needed for pain. 12 tablet 0   No facility-administered medications prior to visit.    ROS Review of Systems  Constitutional: Negative.  Negative for fever, chills, diaphoresis and fatigue.  HENT: Negative.   Eyes: Negative.  Negative for visual disturbance.  Respiratory: Negative.  Negative for cough, choking, chest tightness, shortness of breath and stridor.   Cardiovascular: Negative.  Negative for chest pain, palpitations and leg swelling.  Gastrointestinal: Negative.  Negative for nausea, vomiting, abdominal pain, diarrhea and constipation.  Endocrine: Negative.   Genitourinary: Negative.  Negative for difficulty urinating.  Musculoskeletal: Negative.  Negative for myalgias, back pain, joint swelling and arthralgias.  Skin: Negative.  Negative for color change and rash.  Allergic/Immunologic: Negative.   Neurological: Negative.  Negative for dizziness.  Hematological: Negative.  Negative  for adenopathy. Does not bruise/bleed easily.  Psychiatric/Behavioral: Negative.     Objective:  BP 120/88 mmHg  Pulse 74  Temp(Src) 98.3 F (36.8 C) (Oral)  Ht 5' 4"  (1.626 m)  Wt 231 lb (104.781 kg)  BMI 39.63 kg/m2  SpO2 93%  LMP 05/21/2015  BP Readings from Last 3 Encounters:  06/03/15 120/88  07/15/14 112/80  05/27/14 118/78    Wt Readings from Last 3 Encounters:  06/03/15 231 lb (104.781 kg)  07/15/14 226 lb (102.513 kg)  05/27/14 229 lb (103.874 kg)    Physical Exam  Constitutional: She is oriented to person, place, and time. No distress.  HENT:  Head: Normocephalic and atraumatic.  Mouth/Throat: Oropharynx is clear and moist. No oropharyngeal exudate.  Eyes: Conjunctivae are normal. Right eye exhibits no discharge. Left eye exhibits no discharge. No scleral icterus.  Neck: Normal range of motion. Neck supple. No JVD present. No tracheal deviation present. No thyromegaly present.  Cardiovascular: Normal rate, regular rhythm, normal heart sounds and intact distal pulses.  Exam reveals no gallop and no friction rub.   No murmur heard. Pulmonary/Chest: Effort normal and breath sounds normal. No stridor. No respiratory distress. She has no wheezes. She has no rales. She exhibits no tenderness.  Abdominal: Soft. Bowel sounds are normal. She exhibits no distension and no mass. There is no tenderness. There is no rebound and no guarding.  Musculoskeletal: Normal range of motion. She exhibits no edema or tenderness.  Lymphadenopathy:    She has no cervical adenopathy.  Neurological: She is oriented to person, place, and time.  Skin: Skin is warm and dry. No rash noted. She is  not diaphoretic. No erythema. No pallor.  Vitals reviewed.   Lab Results  Component Value Date   WBC 9.1 06/03/2015   HGB 12.4 06/03/2015   HCT 37.7 06/03/2015   PLT 334.0 06/03/2015   GLUCOSE 89 06/03/2015   CHOL 193 06/03/2015   TRIG 100.0 06/03/2015   HDL 40.20 06/03/2015   LDLCALC 133*  06/03/2015   ALT 16 06/03/2015   AST 15 06/03/2015   NA 139 06/03/2015   K 3.5 06/03/2015   CL 102 06/03/2015   CREATININE 0.92 06/03/2015   BUN 9 06/03/2015   CO2 29 06/03/2015   TSH 1.20 06/03/2015    No results found.  Assessment & Plan:   Marcellene was seen today for hypertension and annual exam.  Diagnoses and all orders for this visit:  Routine general medical examination at a health care facility- exam completed, labs ordered and reviewed, her Pap and mammogram are up-to-date, vaccines are up-to-date, patient education material was given. -     Lipid panel; Future -     Comprehensive metabolic panel; Future -     CBC with Differential/Platelet; Future -     TSH; Future  Essential hypertension, benign- her blood pressure is adequately well controlled on the current regimen, electrolytes and renal function are normal. -     losartan (COZAAR) 100 MG tablet; Take 1 tablet (100 mg total) by mouth daily. -     hydrochlorothiazide (MICROZIDE) 12.5 MG capsule; Take 1 capsule (12.5 mg total) by mouth daily.  History of renal calculi- will continue hydrochlorothiazide to prevent renal stone development -     hydrochlorothiazide (MICROZIDE) 12.5 MG capsule; Take 1 capsule (12.5 mg total) by mouth daily.  I have discontinued Ms. Swearingin's nitrofurantoin (macrocrystal-monohydrate), phenazopyridine, and hydrochlorothiazide. I have also changed her hydrochlorothiazide. Additionally, I am having her maintain her multivitamin and losartan.  Meds ordered this encounter  Medications  . losartan (COZAAR) 100 MG tablet    Sig: Take 1 tablet (100 mg total) by mouth daily.    Dispense:  90 tablet    Refill:  3  . hydrochlorothiazide (MICROZIDE) 12.5 MG capsule    Sig: Take 1 capsule (12.5 mg total) by mouth daily.    Dispense:  90 capsule    Refill:  1     Follow-up: Return in about 6 months (around 12/04/2015).  [name], MD

## 2015-06-03 NOTE — Patient Instructions (Signed)
Hypertension Hypertension, commonly called high blood pressure, is when the force of blood pumping through your arteries is too strong. Your arteries are the blood vessels that carry blood from your heart throughout your body. A blood pressure reading consists of a higher number over a lower number, such as 110/72. The higher number (systolic) is the pressure inside your arteries when your heart pumps. The lower number (diastolic) is the pressure inside your arteries when your heart relaxes. Ideally you want your blood pressure below 120/80. Hypertension forces your heart to work harder to pump blood. Your arteries may become narrow or stiff. Having untreated or uncontrolled hypertension can cause heart attack, stroke, kidney disease, and other problems. RISK FACTORS Some risk factors for high blood pressure are controllable. Others are not.  Risk factors you cannot control include:   Race. You may be at higher risk if you are African American.  Age. Risk increases with age.  Gender. Men are at higher risk than women before age 45 years. After age 65, women are at higher risk than men. Risk factors you can control include:  Not getting enough exercise or physical activity.  Being overweight.  Getting too much fat, sugar, calories, or salt in your diet.  Drinking too much alcohol. SIGNS AND SYMPTOMS Hypertension does not usually cause signs or symptoms. Extremely high blood pressure (hypertensive crisis) may cause headache, anxiety, shortness of breath, and nosebleed. DIAGNOSIS To check if you have hypertension, your health care provider will measure your blood pressure while you are seated, with your arm held at the level of your heart. It should be measured at least twice using the same arm. Certain conditions can cause a difference in blood pressure between your right and left arms. A blood pressure reading that is higher than normal on one occasion does not mean that you need treatment. If  it is not clear whether you have high blood pressure, you may be asked to return on a different day to have your blood pressure checked again. Or, you may be asked to monitor your blood pressure at home for 1 or more weeks. TREATMENT Treating high blood pressure includes making lifestyle changes and possibly taking medicine. Living a healthy lifestyle can help lower high blood pressure. You may need to change some of your habits. Lifestyle changes may include:  Following the DASH diet. This diet is high in fruits, vegetables, and whole grains. It is low in salt, red meat, and added sugars.  Keep your sodium intake below 2,300 mg per day.  Getting at least 30-45 minutes of aerobic exercise at least 4 times per week.  Losing weight if necessary.  Not smoking.  Limiting alcoholic beverages.  Learning ways to reduce stress. Your health care provider may prescribe medicine if lifestyle changes are not enough to get your blood pressure under control, and if one of the following is true:  You are 18-59 years of age and your systolic blood pressure is above 140.  You are 60 years of age or older, and your systolic blood pressure is above 150.  Your diastolic blood pressure is above 90.  You have diabetes, and your systolic blood pressure is over 140 or your diastolic blood pressure is over 90.  You have kidney disease and your blood pressure is above 140/90.  You have heart disease and your blood pressure is above 140/90. Your personal target blood pressure may vary depending on your medical conditions, your age, and other factors. HOME CARE INSTRUCTIONS    Have your blood pressure rechecked as directed by your health care provider.   Take medicines only as directed by your health care provider. Follow the directions carefully. Blood pressure medicines must be taken as prescribed. The medicine does not work as well when you skip doses. Skipping doses also puts you at risk for  problems.  Do not smoke.   Monitor your blood pressure at home as directed by your health care provider. SEEK MEDICAL CARE IF:   You think you are having a reaction to medicines taken.  You have recurrent headaches or feel dizzy.  You have swelling in your ankles.  You have trouble with your vision. SEEK IMMEDIATE MEDICAL CARE IF:  You develop a severe headache or confusion.  You have unusual weakness, numbness, or feel faint.  You have severe chest or abdominal pain.  You vomit repeatedly.  You have trouble breathing. MAKE SURE YOU:   Understand these instructions.  Will watch your condition.  Will get help right away if you are not doing well or get worse.   This information is not intended to replace advice given to you by your health care provider. Make sure you discuss any questions you have with your health care provider.   Document Released: 01/16/2005 Document Revised: 06/02/2014 Document Reviewed: 11/08/2012 Elsevier Interactive Patient Education 2016 [employer]  

## 2015-06-03 NOTE — Progress Notes (Signed)
Pre visit review using our clinic review tool, if applicable. No additional management support is needed unless otherwise documented below in the visit note. 

## 2015-06-04 ENCOUNTER — Encounter: Payer: Self-pay | Admitting: Internal Medicine

## 2015-08-27 MED FILL — LOSARTAN POTASSIUM 100 MG T: 100 | 90 days supply | Qty: 90 | Fill #1

## 2015-08-27 MED FILL — HYDROCHLOROTHIAZIDE 12.5 MG: 12.5 | 90 days supply | Qty: 90 | Fill #1

## 2015-09-08 ENCOUNTER — Other Ambulatory Visit: Payer: Self-pay | Admitting: Obstetrics and Gynecology

## 2015-09-08 DIAGNOSIS — Z1231 Encounter for screening mammogram for malignant neoplasm of breast: Secondary | ICD-10-CM

## 2015-11-26 ENCOUNTER — Ambulatory Visit
Admission: RE | Admit: 2015-11-26 | Discharge: 2015-11-26 | Disposition: A | Discharge status: Home / Self Care | Payer: 59 | Source: Ambulatory Visit | Attending: Obstetrics and Gynecology | Admitting: Obstetrics and Gynecology

## 2015-11-26 DIAGNOSIS — Z1231 Encounter for screening mammogram for malignant neoplasm of breast: Secondary | ICD-10-CM

## 2015-12-06 ENCOUNTER — Other Ambulatory Visit: Payer: Self-pay | Admitting: *Deleted

## 2015-12-06 DIAGNOSIS — Z87442 Personal history of urinary calculi: Secondary | ICD-10-CM

## 2015-12-06 DIAGNOSIS — I10 Essential (primary) hypertension: Secondary | ICD-10-CM

## 2015-12-06 MED ORDER — LOSARTAN POTASSIUM 100 MG PO TABS: 100.0000 mg | ORAL_TABLET | Freq: Every day | ORAL | 1 refills | Status: DC

## 2015-12-06 MED ORDER — HYDROCHLOROTHIAZIDE 12.5 MG PO CAPS: 12.5000 mg | ORAL_CAPSULE | Freq: Every day | ORAL | 1 refills | Status: DC

## 2016-05-15 ENCOUNTER — Other Ambulatory Visit (INDEPENDENT_AMBULATORY_CARE_PROVIDER_SITE_OTHER): Payer: 59

## 2016-05-15 ENCOUNTER — Encounter: Payer: Self-pay | Admitting: Internal Medicine

## 2016-05-15 ENCOUNTER — Ambulatory Visit (INDEPENDENT_AMBULATORY_CARE_PROVIDER_SITE_OTHER): Payer: 59 | Admitting: Internal Medicine

## 2016-05-15 VITALS — BP 118/78 | HR 75 | Temp 98.4°F | Ht 64.0 in | Wt 231.0 lb

## 2016-05-15 DIAGNOSIS — Z0000 Encounter for general adult medical examination without abnormal findings: Secondary | ICD-10-CM

## 2016-05-15 DIAGNOSIS — I10 Essential (primary) hypertension: Secondary | ICD-10-CM | POA: Diagnosis not present

## 2016-05-15 DIAGNOSIS — M436 Torticollis: Secondary | ICD-10-CM

## 2016-05-15 DIAGNOSIS — D500 Iron deficiency anemia secondary to blood loss (chronic): Secondary | ICD-10-CM | POA: Diagnosis not present

## 2016-05-15 LAB — COMPREHENSIVE METABOLIC PANEL
ALBUMIN: 3.8 g/dL (ref 3.5–5.2)
ALT: 15 U/L (ref 0–35)
AST: 16 U/L (ref 0–37)
Alkaline Phosphatase: 64 U/L (ref 39–117)
BUN: 11 mg/dL (ref 6–23)
CHLORIDE: 106 meq/L (ref 96–112)
CO2: 27 meq/L (ref 19–32)
Calcium: 9.1 mg/dL (ref 8.4–10.5)
Creatinine, Ser: 0.98 mg/dL (ref 0.40–1.20)
GFR: 79.32 mL/min (ref >60.00)
GLUCOSE: 105 mg/dL — AB (ref 70–99)
POTASSIUM: 3.9 meq/L (ref 3.5–5.1)
SODIUM: 139 meq/L (ref 135–145)
Total Bilirubin: 0.4 mg/dL (ref 0.2–1.2)
Total Protein: 6.9 g/dL (ref 6.0–8.3)

## 2016-05-15 LAB — CBC WITH DIFFERENTIAL/PLATELET
BASOS PCT: 0.7 % (ref 0.0–3.0)
Basophils Absolute: 0.1 10*3/uL (ref 0.0–0.1)
EOS ABS: 0.5 10*3/uL (ref 0.0–0.7)
Eosinophils Relative: 5.9 % — ABNORMAL HIGH (ref 0.0–5.0)
HEMATOCRIT: 36.4 % (ref 36.0–46.0)
Hemoglobin: 12.1 g/dL (ref 12.0–15.0)
LYMPHS ABS: 2.3 10*3/uL (ref 0.7–4.0)
Lymphocytes Relative: 28.7 % (ref 12.0–46.0)
MCHC: 33.2 g/dL (ref 30.0–36.0)
MCV: 90.4 fl (ref 78.0–100.0)
MONO ABS: 0.7 10*3/uL (ref 0.1–1.0)
Monocytes Relative: 8.3 % (ref 3.0–12.0)
NEUTROS ABS: 4.5 10*3/uL (ref 1.4–7.7)
NEUTROS PCT: 56.4 % (ref 43.0–77.0)
PLATELETS: 293 10*3/uL (ref 150.0–400.0)
RBC: 4.03 Mil/uL (ref 3.87–5.11)
RDW: 13.2 % (ref 11.5–15.5)
WBC: 8 10*3/uL (ref 4.0–10.5)

## 2016-05-15 LAB — URINALYSIS, ROUTINE W REFLEX MICROSCOPIC
Bilirubin Urine: NEGATIVE
Ketones, ur: NEGATIVE
Nitrite: NEGATIVE
SPECIFIC GRAVITY, URINE: 1.02 (ref 1.000–1.030)
UROBILINOGEN UA: 0.2 (ref 0.0–1.0)
Urine Glucose: NEGATIVE
pH: 6 (ref 5.0–8.0)

## 2016-05-15 LAB — IBC PANEL
IRON: 58 ug/dL (ref 42–145)
SATURATION RATIOS: 13.9 % — AB (ref 20.0–50.0)
Transferrin: 299 mg/dL (ref 212.0–360.0)

## 2016-05-15 LAB — LIPID PANEL
CHOL/HDL RATIO: 5
Cholesterol: 181 mg/dL (ref 0–200)
HDL: 37.7 mg/dL — AB (ref >39.00)
LDL Cholesterol: 129 mg/dL — ABNORMAL HIGH (ref 0–99)
NONHDL: 143.77
Triglycerides: 74 mg/dL (ref 0.0–149.0)
VLDL: 14.8 mg/dL (ref 0.0–40.0)

## 2016-05-15 LAB — FERRITIN: Ferritin: 47.7 ng/mL (ref 10.0–291.0)

## 2016-05-15 LAB — TSH: TSH: 1.84 u[IU]/mL (ref 0.35–4.50)

## 2016-05-15 MED ORDER — ETODOLAC ER 400 MG PO TB24: 400.0000 mg | ORAL_TABLET | Freq: Every day | ORAL | 0 refills | Status: DC

## 2016-05-15 MED ORDER — CYCLOBENZAPRINE HCL 7.5 MG PO TABS: 7.5000 mg | ORAL_TABLET | Freq: Three times a day (TID) | ORAL | 0 refills | Status: DC | PRN

## 2016-05-15 NOTE — Patient Instructions (Signed)
Acute Torticollis Torticollis is a condition in which the muscles of the neck tighten (contract) abnormally, causing the neck to twist and the head to move into an unnatural position. Torticollis that develops suddenly is called acute torticollis. If torticollis becomes chronic and is left untreated, the face and neck can become deformed. CAUSES This condition may be caused by:  Sleeping in an awkward position (common).  Extending or twisting the neck muscles beyond their normal position.  Infection. In some cases, the cause may not be known. SYMPTOMS Symptoms of this condition include:  An unnatural position of the head.  Neck pain.  A limited ability to move the neck.  Twisting of the neck to one side. DIAGNOSIS This condition is diagnosed with a physical exam. You may also have imaging tests, such as an X-ray, CT scan, or MRI. TREATMENT Treatment for this condition involves trying to relax the neck muscles. It may include:  Medicines or shots.  Physical therapy.  Surgery. This may be done in severe cases. HOME CARE INSTRUCTIONS  Take medicines only as directed by your health care provider.  Do stretching exercises and massage your neck as directed by your health care provider.  Keep all follow-up visits as directed by your health care provider. This is important. SEEK MEDICAL CARE IF:  You develop a fever. SEEK IMMEDIATE MEDICAL CARE IF:  You develop difficulty breathing.  You develop noisy breathing (stridor).  You start drooling.  You have trouble swallowing or have pain with swallowing.  You develop numbness or weakness in your hands or feet.  You have changes in your speech, understanding, or vision.  Your pain gets worse. This information is not intended to replace advice given to you by your health care provider. Make sure you discuss any questions you have with your health care provider. Document Released: 01/14/2000 Document Revised: 05/10/2015  Document Reviewed: 01/12/2014 Elsevier Interactive Patient Education  2017 [employer].

## 2016-05-15 NOTE — Progress Notes (Signed)
Subjective:  Patient ID: Penny Ramirez, female    DOB: 1970-10-13  Age: 46 y.o. MRN: [id number]  CC: Annual Exam; Anemia; Hypertension; and Neck Pain   HPI Penny Ramirez presents for a CPX.  She complains of a 3 day history of right-sided neck discomfort with no prior trauma or injury. She describes it as a pinching sensation that radiates towards her right shoulder blade and not into her right upper extremity. She denies numbness, weakness, tingling in her arms. She also has intermittent spasms and says the pain keeps her awake at night. She has not taken anything to control the pain.  Outpatient Medications Prior to Visit  Medication Sig Dispense Refill  . hydrochlorothiazide (MICROZIDE) 12.5 MG capsule Take 1 capsule (12.5 mg total) by mouth daily. 90 capsule 1  . losartan (COZAAR) 100 MG tablet Take 1 tablet (100 mg total) by mouth daily. 90 tablet 1  . Multiple Vitamin (MULTIVITAMIN) capsule Take 1 capsule by mouth daily.     No facility-administered medications prior to visit.     ROS Review of Systems  Constitutional: Negative.  Negative for appetite change, chills, diaphoresis, fatigue and unexpected weight change.  HENT: Negative.  Negative for sinus pressure, trouble swallowing and voice change.   Eyes: Negative.   Respiratory: Negative.  Negative for cough, chest tightness, shortness of breath and wheezing.   Cardiovascular: Negative for chest pain, palpitations and leg swelling.  Gastrointestinal: Negative for abdominal pain, diarrhea, nausea and vomiting.  Endocrine: Negative.   Genitourinary: Negative.   Musculoskeletal: Positive for neck pain and neck stiffness. Negative for back pain, joint swelling and myalgias.  Skin: Negative.  Negative for color change.  Allergic/Immunologic: Negative.   Neurological: Negative.  Negative for dizziness, weakness, numbness and headaches.  Hematological: Negative for adenopathy. Does not bruise/bleed easily.    Psychiatric/Behavioral: Negative.     Objective:  BP 118/78 (BP Location: Left Arm, Patient Position: Sitting, Cuff Size: Large)   Pulse 75   Temp 98.4 F (36.9 C) (Oral)   Ht 5' 4"  (1.626 m)   Wt 231 lb (104.8 kg)   LMP 04/26/2016   SpO2 99%   BMI 39.65 kg/m   BP Readings from Last 3 Encounters:  05/15/16 118/78  06/03/15 120/88  07/15/14 112/80    Wt Readings from Last 3 Encounters:  05/15/16 231 lb (104.8 kg)  06/03/15 231 lb (104.8 kg)  07/15/14 226 lb (102.5 kg)    Physical Exam  Constitutional: She is oriented to person, place, and time. No distress.  HENT:  Mouth/Throat: Oropharynx is clear and moist. No oropharyngeal exudate.  Eyes: Conjunctivae are normal. Right eye exhibits no discharge. Left eye exhibits no discharge. No scleral icterus.  Neck: Normal range of motion. Neck supple. No JVD present. No tracheal deviation present. No thyromegaly present.  Cardiovascular: Normal rate, regular rhythm, normal heart sounds and intact distal pulses.  Exam reveals no gallop and no friction rub.   No murmur heard. Pulmonary/Chest: Effort normal and breath sounds normal. No stridor. No respiratory distress. She has no wheezes. She has no rales. She exhibits no tenderness.  Abdominal: Soft. Bowel sounds are normal. She exhibits no distension and no mass. There is no tenderness. There is no rebound and no guarding.  Musculoskeletal: Normal range of motion. She exhibits no edema, tenderness or deformity.       Cervical back: Normal. She exhibits normal range of motion, no tenderness, no bony tenderness, no swelling, no edema, no deformity, no  pain and no spasm.  Lymphadenopathy:    She has no cervical adenopathy.  Neurological: She is oriented to person, place, and time. She has normal reflexes. She displays no atrophy, no tremor and normal reflexes. No cranial nerve deficit or sensory deficit. She exhibits normal muscle tone. She displays no seizure activity. Coordination and  gait normal.  Skin: Skin is warm and dry. No rash noted. She is not diaphoretic. No erythema. No pallor.  Vitals reviewed.   Lab Results  Component Value Date   WBC 8.0 05/15/2016   HGB 12.1 05/15/2016   HCT 36.4 05/15/2016   PLT 293.0 05/15/2016   GLUCOSE 105 (H) 05/15/2016   CHOL 181 05/15/2016   TRIG 74.0 05/15/2016   HDL 37.70 (L) 05/15/2016   LDLCALC 129 (H) 05/15/2016   ALT 15 05/15/2016   AST 16 05/15/2016   NA 139 05/15/2016   K 3.9 05/15/2016   CL 106 05/15/2016   CREATININE 0.98 05/15/2016   BUN 11 05/15/2016   CO2 27 05/15/2016   TSH 1.84 05/15/2016    Mm Screening Breast Tomo Bilateral  Result Date: 11/26/2015 CLINICAL DATA:  Screening. EXAM: 2D DIGITAL SCREENING BILATERAL MAMMOGRAM WITH CAD AND ADJUNCT TOMO COMPARISON:  Previous exam(s). ACR Breast Density Category b: There are scattered areas of fibroglandular density. FINDINGS: There are no findings suspicious for malignancy. Images were processed with CAD. IMPRESSION: No mammographic evidence of malignancy. A result letter of this screening mammogram will be mailed directly to the patient. RECOMMENDATION: Screening mammogram in one year. (Code:SM-B-01Y) BI-RADS CATEGORY  1: Negative. Electronically Signed   By: Franki Cabot M.D.   On: 11/29/2015 16:08    Assessment & Plan:   Penny Ramirez was seen today for annual exam, anemia, hypertension and neck pain.  Diagnoses and all orders for this visit:  Essential hypertension, benign- Her blood pressure is adequately well-controlled, electrolytes and renal function are normal. -     Comprehensive metabolic panel; Future -     TSH; Future -     Urinalysis, Routine w reflex microscopic; Future  Routine general medical examination at a health care facility- exam completed, labs ordered and reviewed, vaccines reviewed, her mammogram and Pap are up-to-date, patient education material was given. -     Lipid panel; Future  Iron deficiency anemia due to chronic blood loss-  her H&H and iron levels are normal now, she will continue taking over-the-counter iron supplements. -     CBC with Differential/Platelet; Future -     IBC panel; Future -     Ferritin; Future  [place] -     etodolac (LODINE XL) 400 MG 24 hr tablet; Take 1 tablet (400 mg total) by mouth daily. -     cyclobenzaprine (FEXMID) 7.5 MG tablet; Take 1 tablet (7.5 mg total) by mouth 3 (three) times daily as needed for muscle spasms.   I have discontinued Ms. Kendle's multivitamin. I am also having her start on etodolac and cyclobenzaprine. Additionally, I am having her maintain her hydrochlorothiazide and losartan.  Meds ordered this encounter  Medications  . etodolac (LODINE XL) 400 MG 24 hr tablet    Sig: Take 1 tablet (400 mg total) by mouth daily.    Dispense:  30 tablet    Refill:  0  . cyclobenzaprine (FEXMID) 7.5 MG tablet    Sig: Take 1 tablet (7.5 mg total) by mouth 3 (three) times daily as needed for muscle spasms.    Dispense:  30 tablet  Refill:  0     Follow-up: Return in about 3 weeks (around 06/05/2016).  [name], MD

## 2016-05-15 NOTE — Progress Notes (Signed)
Pre visit review using our clinic review tool, if applicable. No additional management support is needed unless otherwise documented below in the visit note. 

## 2016-05-22 ENCOUNTER — Telehealth: Payer: Self-pay | Admitting: Internal Medicine

## 2016-05-22 DIAGNOSIS — M436 Torticollis: Secondary | ICD-10-CM

## 2016-05-22 MED ORDER — ETODOLAC ER 400 MG PO TB24: 400.0000 mg | ORAL_TABLET | Freq: Every day | ORAL | 0 refills | Status: DC

## 2016-05-22 MED ORDER — CYCLOBENZAPRINE HCL 7.5 MG PO TABS: 7.5000 mg | ORAL_TABLET | Freq: Three times a day (TID) | ORAL | 0 refills | Status: DC | PRN

## 2016-05-22 NOTE — Telephone Encounter (Signed)
Updated pharmacy and resent rx's to [facility].Marland KitchenJohny Ramirez

## 2016-05-22 NOTE — Telephone Encounter (Signed)
Pt called and states the meds prescibed on 4/16 were sent to [facility] and she does not use that pharmacy, Please resend to   cyclobenzaprine (FEXMID) 7.5 MG tablet etodolac (LODINE XL) 400 MG 24 hr tablet   [facility] on [address]

## 2016-06-18 ENCOUNTER — Other Ambulatory Visit: Payer: Self-pay | Admitting: Internal Medicine

## 2016-06-18 DIAGNOSIS — I10 Essential (primary) hypertension: Secondary | ICD-10-CM

## 2016-06-18 DIAGNOSIS — Z87442 Personal history of urinary calculi: Secondary | ICD-10-CM

## 2016-08-25 ENCOUNTER — Encounter: Payer: Self-pay | Admitting: Internal Medicine

## 2016-08-25 DIAGNOSIS — Z87442 Personal history of urinary calculi: Secondary | ICD-10-CM

## 2016-08-25 DIAGNOSIS — I10 Essential (primary) hypertension: Secondary | ICD-10-CM

## 2016-08-25 MED ORDER — LOSARTAN POTASSIUM 100 MG PO TABS: 100.0000 mg | ORAL_TABLET | Freq: Every day | ORAL | 1 refills | Status: DC

## 2016-08-25 MED ORDER — HYDROCHLOROTHIAZIDE 12.5 MG PO CAPS: 12.5000 mg | ORAL_CAPSULE | Freq: Every day | ORAL | 1 refills | Status: DC

## 2016-08-25 MED FILL — HYDROCHLOROTHIAZIDE 12.5 MG: 12.5 | 90 days supply | Qty: 90 | Fill #0

## 2016-08-25 MED FILL — LOSARTAN POTASSIUM 100 MG T: 100 | 90 days supply | Qty: 90 | Fill #0

## 2016-10-23 ENCOUNTER — Other Ambulatory Visit: Payer: Self-pay | Admitting: Obstetrics and Gynecology

## 2016-10-23 DIAGNOSIS — Z1231 Encounter for screening mammogram for malignant neoplasm of breast: Secondary | ICD-10-CM

## 2016-11-27 ENCOUNTER — Ambulatory Visit
Admission: RE | Admit: 2016-11-27 | Discharge: 2016-11-27 | Disposition: A | Discharge status: Home / Self Care | Payer: 59 | Source: Ambulatory Visit | Attending: Obstetrics and Gynecology | Admitting: Obstetrics and Gynecology

## 2016-11-27 DIAGNOSIS — Z1231 Encounter for screening mammogram for malignant neoplasm of breast: Secondary | ICD-10-CM

## 2016-11-29 MED FILL — LOSARTAN POTASSIUM 100 MG T: 100 | 90 days supply | Qty: 90 | Fill #1

## 2016-11-29 MED FILL — HYDROCHLOROTHIAZIDE 12.5 MG: 12.5 | 90 days supply | Qty: 90 | Fill #1

## 2016-12-25 ENCOUNTER — Other Ambulatory Visit (HOSPITAL_COMMUNITY)
Admission: RE | Admit: 2016-12-25 | Discharge: 2016-12-25 | Disposition: A | Discharge status: Home / Self Care | Payer: 59 | Source: Ambulatory Visit | Attending: Obstetrics and Gynecology | Admitting: Obstetrics and Gynecology

## 2016-12-25 ENCOUNTER — Other Ambulatory Visit: Payer: Self-pay | Admitting: Obstetrics and Gynecology

## 2016-12-25 DIAGNOSIS — Z124 Encounter for screening for malignant neoplasm of cervix: Secondary | ICD-10-CM | POA: Insufficient documentation

## 2016-12-25 DIAGNOSIS — Z01419 Encounter for gynecological examination (general) (routine) without abnormal findings: Secondary | ICD-10-CM | POA: Diagnosis not present

## 2016-12-29 LAB — CYTOLOGY - PAP
Diagnosis: NEGATIVE
HPV (WINDOPATH): DETECTED — AB
HPV 16/18/45 genotyping: NEGATIVE

## 2017-03-02 ENCOUNTER — Other Ambulatory Visit: Payer: Self-pay | Admitting: Internal Medicine

## 2017-03-02 DIAGNOSIS — I10 Essential (primary) hypertension: Secondary | ICD-10-CM

## 2017-03-02 DIAGNOSIS — Z87442 Personal history of urinary calculi: Secondary | ICD-10-CM

## 2017-03-16 ENCOUNTER — Encounter: Payer: Self-pay | Admitting: Internal Medicine

## 2017-03-16 DIAGNOSIS — I10 Essential (primary) hypertension: Secondary | ICD-10-CM

## 2017-03-16 DIAGNOSIS — Z87442 Personal history of urinary calculi: Secondary | ICD-10-CM

## 2017-03-16 MED ORDER — LOSARTAN POTASSIUM 100 MG PO TABS: 100.0000 mg | ORAL_TABLET | Freq: Every day | ORAL | 0 refills | Status: DC

## 2017-03-16 MED FILL — LOSARTAN POTASSIUM 100 MG T: 100 | 30 days supply | Qty: 30 | Fill #0

## 2017-03-19 MED ORDER — HYDROCHLOROTHIAZIDE 12.5 MG PO CAPS: 12.5000 mg | ORAL_CAPSULE | Freq: Every day | ORAL | 1 refills | Status: DC

## 2017-03-19 MED ORDER — LOSARTAN POTASSIUM 100 MG PO TABS: 100.0000 mg | ORAL_TABLET | Freq: Every day | ORAL | 1 refills | Status: DC

## 2017-03-19 MED FILL — HYDROCHLOROTHIAZIDE 12.5 MG: 12.5 | 30 days supply | Qty: 30 | Fill #0

## 2017-03-19 NOTE — Addendum Note (Signed)
Addended by: Karle Barr on: 03/19/2017 08:44 AM   Modules accepted: Orders

## 2017-05-22 ENCOUNTER — Ambulatory Visit (INDEPENDENT_AMBULATORY_CARE_PROVIDER_SITE_OTHER): Payer: 59 | Admitting: Internal Medicine

## 2017-05-22 ENCOUNTER — Encounter: Payer: Self-pay | Admitting: Internal Medicine

## 2017-05-22 ENCOUNTER — Other Ambulatory Visit (INDEPENDENT_AMBULATORY_CARE_PROVIDER_SITE_OTHER): Payer: 59

## 2017-05-22 VITALS — BP 144/94 | HR 81 | Temp 98.6°F | Resp 16 | Ht 64.0 in | Wt 234.0 lb

## 2017-05-22 DIAGNOSIS — I10 Essential (primary) hypertension: Secondary | ICD-10-CM

## 2017-05-22 DIAGNOSIS — Z0001 Encounter for general adult medical examination with abnormal findings: Secondary | ICD-10-CM

## 2017-05-22 DIAGNOSIS — Z0000 Encounter for general adult medical examination without abnormal findings: Secondary | ICD-10-CM

## 2017-05-22 LAB — LIPID PANEL
CHOLESTEROL: 167 mg/dL (ref 0–200)
HDL: 45 mg/dL (ref >39.00)
LDL Cholesterol: 108 mg/dL — ABNORMAL HIGH (ref 0–99)
NONHDL: 122.42
Total CHOL/HDL Ratio: 4
Triglycerides: 73 mg/dL (ref 0.0–149.0)
VLDL: 14.6 mg/dL (ref 0.0–40.0)

## 2017-05-22 LAB — URINALYSIS, ROUTINE W REFLEX MICROSCOPIC
Bilirubin Urine: NEGATIVE
Ketones, ur: NEGATIVE
NITRITE: NEGATIVE
Specific Gravity, Urine: 1.01 (ref 1.000–1.030)
TOTAL PROTEIN, URINE-UPE24: NEGATIVE
Urine Glucose: NEGATIVE
Urobilinogen, UA: 0.2 (ref 0.0–1.0)
pH: 7 (ref 5.0–8.0)

## 2017-05-22 LAB — CBC WITH DIFFERENTIAL/PLATELET
BASOS PCT: 0.7 % (ref 0.0–3.0)
Basophils Absolute: 0.1 10*3/uL (ref 0.0–0.1)
EOS PCT: 4.2 % (ref 0.0–5.0)
Eosinophils Absolute: 0.3 10*3/uL (ref 0.0–0.7)
HCT: 33.6 % — ABNORMAL LOW (ref 36.0–46.0)
Hemoglobin: 11.8 g/dL — ABNORMAL LOW (ref 12.0–15.0)
LYMPHS ABS: 2.5 10*3/uL (ref 0.7–4.0)
Lymphocytes Relative: 31.1 % (ref 12.0–46.0)
MCHC: 35.2 g/dL (ref 30.0–36.0)
MCV: 89.9 fl (ref 78.0–100.0)
MONO ABS: 0.5 10*3/uL (ref 0.1–1.0)
Monocytes Relative: 6.5 % (ref 3.0–12.0)
NEUTROS PCT: 57.5 % (ref 43.0–77.0)
Neutro Abs: 4.6 10*3/uL (ref 1.4–7.7)
Platelets: 343 10*3/uL (ref 150.0–400.0)
RBC: 3.74 Mil/uL — ABNORMAL LOW (ref 3.87–5.11)
RDW: 13.3 % (ref 11.5–15.5)
WBC: 7.9 10*3/uL (ref 4.0–10.5)

## 2017-05-22 LAB — COMPREHENSIVE METABOLIC PANEL
ALT: 11 U/L (ref 0–35)
AST: 13 U/L (ref 0–37)
Albumin: 3.7 g/dL (ref 3.5–5.2)
Alkaline Phosphatase: 70 U/L (ref 39–117)
BUN: 7 mg/dL (ref 6–23)
CHLORIDE: 104 meq/L (ref 96–112)
CO2: 29 mEq/L (ref 19–32)
Calcium: 8.9 mg/dL (ref 8.4–10.5)
Creatinine, Ser: 0.89 mg/dL (ref 0.40–1.20)
GFR: 88.23 mL/min (ref >60.00)
GLUCOSE: 90 mg/dL (ref 70–99)
POTASSIUM: 3.7 meq/L (ref 3.5–5.1)
SODIUM: 138 meq/L (ref 135–145)
Total Bilirubin: 0.4 mg/dL (ref 0.2–1.2)
Total Protein: 6.7 g/dL (ref 6.0–8.3)

## 2017-05-22 LAB — THYROID PANEL WITH TSH
Free Thyroxine Index: 2.4 (ref 1.4–3.8)
T3 Uptake: 27 % (ref 22–35)
T4, Total: 8.8 ug/dL (ref 5.1–11.9)
TSH: 1.35 m[IU]/L

## 2017-05-22 MED ORDER — CHLORTHALIDONE 25 MG PO TABS: 25.0000 mg | ORAL_TABLET | Freq: Every day | ORAL | 0 refills | Status: DC

## 2017-05-22 MED ORDER — LIRAGLUTIDE -WEIGHT MANAGEMENT 18 MG/3ML ~~LOC~~ SOPN: 3.0000 mg | PEN_INJECTOR | Freq: Every day | SUBCUTANEOUS | 5 refills | Status: DC

## 2017-05-22 MED FILL — CHLORTHALIDONE 25 MG TAB: 25 | 90 days supply | Qty: 90 | Fill #0

## 2017-05-22 NOTE — Patient Instructions (Signed)
Preventive Care 18-39 Years, Female Preventive care refers to lifestyle choices and visits with your health care provider that can promote health and wellness. What does preventive care include?  A yearly physical exam. This is also called an annual well check.  Dental exams once or twice a year.  Routine eye exams. Ask your health care provider how often you should have your eyes checked.  Personal lifestyle choices, including: ? Daily care of your teeth and gums. ? Regular physical activity. ? Eating a healthy diet. ? Avoiding tobacco and drug use. ? Limiting alcohol use. ? Practicing safe sex. ? Taking vitamin and mineral supplements as recommended by your health care provider. What happens during an annual well check? The services and screenings done by your health care provider during your annual well check will depend on your age, overall health, lifestyle risk factors, and family history of disease. Counseling Your health care provider may ask you questions about your:  Alcohol use.  Tobacco use.  Drug use.  Emotional well-being.  Home and relationship well-being.  Sexual activity.  Eating habits.  Work and work Statistician.  Method of birth control.  Menstrual cycle.  Pregnancy history.  Screening You may have the following tests or measurements:  Height, weight, and BMI.  Diabetes screening. This is done by checking your blood sugar (glucose) after you have not eaten for a while (fasting).  Blood pressure.  Lipid and cholesterol levels. These may be checked every 5 years starting at age 66.  Skin check.  Hepatitis C blood test.  Hepatitis B blood test.  Sexually transmitted disease (STD) testing.  BRCA-related cancer screening. This may be done if you have a family history of breast, ovarian, tubal, or peritoneal cancers.  Pelvic exam and Pap test. This may be done every 3 years starting at age 40. Starting at age 59, this may be done every 5  years if you have a Pap test in combination with an HPV test.  Discuss your test results, treatment options, and if necessary, the need for more tests with your health care provider. Vaccines Your health care provider may recommend certain vaccines, such as:  Influenza vaccine. This is recommended every year.  Tetanus, diphtheria, and acellular pertussis (Tdap, Td) vaccine. You may need a Td booster every 10 years.  Varicella vaccine. You may need this if you have not been vaccinated.  HPV vaccine. If you are 69 or younger, you may need three doses over 6 months.  Measles, mumps, and rubella (MMR) vaccine. You may need at least one dose of MMR. You may also need a second dose.  Pneumococcal 13-valent conjugate (PCV13) vaccine. You may need this if you have certain conditions and were not previously vaccinated.  Pneumococcal polysaccharide (PPSV23) vaccine. You may need one or two doses if you smoke cigarettes or if you have certain conditions.  Meningococcal vaccine. One dose is recommended if you are age 27-21 years and a first-year college student living in a residence hall, or if you have one of several medical conditions. You may also need additional booster doses.  Hepatitis A vaccine. You may need this if you have certain conditions or if you travel or work in places where you may be exposed to hepatitis A.  Hepatitis B vaccine. You may need this if you have certain conditions or if you travel or work in places where you may be exposed to hepatitis B.  Haemophilus influenzae type b (Hib) vaccine. You may need this if  you have certain risk factors.  Talk to your health care provider about which screenings and vaccines you need and how often you need them. This information is not intended to replace advice given to you by your health care provider. Make sure you discuss any questions you have with your health care provider. Document Released: 03/14/2001 Document Revised: 10/06/2015  Document Reviewed: 11/17/2014 Elsevier Interactive Patient Education  [employer].

## 2017-05-22 NOTE — Progress Notes (Signed)
Subjective:  Patient ID: Penny Ramirez, female    DOB: 1970-08-10  Age: 47 y.o. MRN: [id number]  CC: Hypertension and Annual Exam   HPI Penny Ramirez presents for a CPX.  She is concerned that her blood pressure has not been well controlled on the combination of an ARB and hydrochlorothiazide.  She tells me that her blood pressure at home has been up to 136/94 and 135/97.  She also has mild swelling in her lower extremities.  She denies headache, blurred vision, chest pain, shortness of breath, palpitations, or fatigue.  She is also concerned about weight gain and wants to try medication to help decrease her caloric intake and to help her lose weight.  Outpatient Medications Prior to Visit  Medication Sig Dispense Refill  . cyclobenzaprine (FEXMID) 7.5 MG tablet Take 1 tablet (7.5 mg total) by mouth 3 (three) times daily as needed for muscle spasms. 30 tablet 0  . etodolac (LODINE XL) 400 MG 24 hr tablet Take 1 tablet (400 mg total) by mouth daily. 30 tablet 0  . hydrochlorothiazide (MICROZIDE) 12.5 MG capsule Take 1 capsule (12.5 mg total) by mouth daily. 30 capsule 1  . losartan (COZAAR) 100 MG tablet Take 1 tablet (100 mg total) by mouth daily. 30 tablet 1   No facility-administered medications prior to visit.     ROS Review of Systems  Constitutional: Negative.  Negative for appetite change, diaphoresis, fatigue and unexpected weight change.  HENT: Negative.   Eyes: Negative.   Respiratory: Negative for apnea, cough, shortness of breath and wheezing.   Cardiovascular: Positive for leg swelling. Negative for chest pain and palpitations.  Gastrointestinal: Negative.  Negative for abdominal pain, constipation, diarrhea, nausea and vomiting.  Endocrine: Negative.   Genitourinary: Negative.  Negative for difficulty urinating, dysuria and frequency.  Musculoskeletal: Negative.  Negative for arthralgias, back pain and neck pain.  Skin: Negative for color change, pallor and rash.    Allergic/Immunologic: Negative.   Neurological: Negative.  Negative for dizziness, weakness, light-headedness and numbness.  Hematological: Negative for adenopathy. Does not bruise/bleed easily.  Psychiatric/Behavioral: Negative.     Objective:  BP (!) 144/94 (BP Location: Left Arm, Patient Position: Sitting, Cuff Size: Large)   Pulse 81   Temp 98.6 F (37 C) (Oral)   Resp 16   Ht 5' 4"  (1.626 m)   Wt 234 lb (106.1 kg)   SpO2 98%   BMI 40.17 kg/m   BP Readings from Last 3 Encounters:  05/22/17 (!) 144/94  05/15/16 118/78  06/03/15 120/88    Wt Readings from Last 3 Encounters:  05/22/17 234 lb (106.1 kg)  05/15/16 231 lb (104.8 kg)  06/03/15 231 lb (104.8 kg)    Physical Exam  Constitutional: She is oriented to person, place, and time. No distress.  HENT:  Mouth/Throat: Oropharynx is clear and moist. No oropharyngeal exudate.  Eyes: Conjunctivae are normal. No scleral icterus.  Neck: Normal range of motion. Neck supple. No JVD present. No thyromegaly present.  Cardiovascular: Normal rate, regular rhythm and normal heart sounds. Exam reveals no gallop and no friction rub.  No murmur heard. EKG ----  Sinus  Rhythm  WITHIN NORMAL LIMITS - no change from the prior EKG  Pulmonary/Chest: Effort normal and breath sounds normal. No stridor. No respiratory distress. She has no wheezes. She has no rales.  Abdominal: Soft. Bowel sounds are normal. She exhibits no mass. There is no tenderness.  Musculoskeletal: She exhibits edema. She exhibits no tenderness or  deformity.  Trace pitting edema in BLE  Lymphadenopathy:    She has no cervical adenopathy.  Neurological: She is alert and oriented to person, place, and time.  Skin: Skin is warm and dry. She is not diaphoretic. No pallor.  Vitals reviewed.   Lab Results  Component Value Date   WBC 7.9 05/22/2017   HGB 11.8 (L) 05/22/2017   HCT 33.6 (L) 05/22/2017   PLT 343.0 05/22/2017   GLUCOSE 90 05/22/2017   CHOL 167  05/22/2017   TRIG 73.0 05/22/2017   HDL 45.00 05/22/2017   LDLCALC 108 (H) 05/22/2017   ALT 11 05/22/2017   AST 13 05/22/2017   NA 138 05/22/2017   K 3.7 05/22/2017   CL 104 05/22/2017   CREATININE 0.89 05/22/2017   BUN 7 05/22/2017   CO2 29 05/22/2017   TSH 1.35 05/22/2017    No results found.  Assessment & Plan:   Penny Ramirez was seen today for hypertension and annual exam.  Diagnoses and all orders for this visit:  Essential hypertension, benign- Her blood pressure is not adequately well controlled.  Her EKG is negative for LVH.  I do not think the ARB is benefiting from her so I will discontinue that and upgrade her to a more potent thiazide diuretic.  Her labs are negative for secondary causes or endorgan damage. -     EKG 12-Lead -     chlorthalidone (HYGROTON) 25 MG tablet; Take 1 tablet (25 mg total) by mouth daily. -     Comprehensive metabolic panel; Future -     CBC with Differential/Platelet; Future -     Thyroid Panel With TSH; Future -     Urinalysis, Routine w reflex microscopic; Future  Severe obesity (BMI >= 40) ([place])- In addition to lifestyle modifications have asked her to start a high-dose GLP-1 agonist to help her lose weight. -     Liraglutide -Weight Management (SAXENDA) 18 MG/3ML SOPN; Inject 3 mg into the skin daily. -     Thyroid Panel With TSH; Future  Routine general medical examination at a health care facility-exam completed, labs reviewed, she refused a flu vaccine, mammogram is up-to-date, Pap smear is up-to-date, patient education material was given. -     Lipid panel; Future   I have discontinued Arkie K. Tamblyn's cyclobenzaprine, etodolac, hydrochlorothiazide, and losartan. I am also having her start on chlorthalidone and Liraglutide -Weight Management.  Meds ordered this encounter  Medications  . chlorthalidone (HYGROTON) 25 MG tablet    Sig: Take 1 tablet (25 mg total) by mouth daily.    Dispense:  90 tablet    Refill:  0  . Liraglutide  -Weight Management (SAXENDA) 18 MG/3ML SOPN    Sig: Inject 3 mg into the skin daily.    Dispense:  5 pen    Refill:  5     Follow-up: Return in about 3 months (around 08/21/2017).  [name], MD

## 2017-05-24 ENCOUNTER — Telehealth: Payer: Self-pay

## 2017-05-24 NOTE — Telephone Encounter (Signed)
Key: [id number]  PA stated - Message from Plan: Patient does not have active coverage with the payer.   Called pt and informed of same. Pt will call rx coverage and let me know what the stated.

## 2017-06-01 NOTE — Telephone Encounter (Signed)
Reason for CRM: Jerod from [facility] [facility] [employer] [phone]) called requesting to speak with someone who handles medication authorizations and verification of patient's information. Ref # for this patient is [id number].

## 2017-06-01 NOTE — Telephone Encounter (Addendum)
Contacted [employer] and they informed that they did not have the correct form. Form resubmitted and PA was denied. Called [employer] to Appeal the denial. LVM for pt regarding same.

## 2017-06-06 ENCOUNTER — Other Ambulatory Visit: Payer: Self-pay | Admitting: Internal Medicine

## 2017-06-06 MED ORDER — INSULIN PEN NEEDLE 32G X 6 MM MISC: 1.0000 | Freq: Every day | 1 refills | Status: DC

## 2017-08-01 ENCOUNTER — Encounter: Payer: Self-pay | Admitting: Internal Medicine

## 2017-08-20 ENCOUNTER — Other Ambulatory Visit (INDEPENDENT_AMBULATORY_CARE_PROVIDER_SITE_OTHER): Payer: 59

## 2017-08-20 ENCOUNTER — Ambulatory Visit (INDEPENDENT_AMBULATORY_CARE_PROVIDER_SITE_OTHER): Payer: 59 | Admitting: Internal Medicine

## 2017-08-20 ENCOUNTER — Encounter: Payer: Self-pay | Admitting: Internal Medicine

## 2017-08-20 VITALS — BP 144/90 | HR 97 | Temp 98.8°F | Resp 16 | Ht 64.0 in | Wt 229.0 lb

## 2017-08-20 DIAGNOSIS — E559 Vitamin D deficiency, unspecified: Secondary | ICD-10-CM | POA: Diagnosis not present

## 2017-08-20 DIAGNOSIS — D500 Iron deficiency anemia secondary to blood loss (chronic): Secondary | ICD-10-CM

## 2017-08-20 DIAGNOSIS — E876 Hypokalemia: Secondary | ICD-10-CM

## 2017-08-20 DIAGNOSIS — I10 Essential (primary) hypertension: Secondary | ICD-10-CM

## 2017-08-20 LAB — CBC WITH DIFFERENTIAL/PLATELET
BASOS ABS: 0.1 10*3/uL (ref 0.0–0.1)
Basophils Relative: 0.7 % (ref 0.0–3.0)
Eosinophils Absolute: 0.5 10*3/uL (ref 0.0–0.7)
Eosinophils Relative: 5.6 % — ABNORMAL HIGH (ref 0.0–5.0)
HCT: 38.7 % (ref 36.0–46.0)
HEMOGLOBIN: 12.9 g/dL (ref 12.0–15.0)
LYMPHS PCT: 31.3 % (ref 12.0–46.0)
Lymphs Abs: 2.9 10*3/uL (ref 0.7–4.0)
MCHC: 33.4 g/dL (ref 30.0–36.0)
MCV: 87.1 fl (ref 78.0–100.0)
MONOS PCT: 8.3 % (ref 3.0–12.0)
Monocytes Absolute: 0.8 10*3/uL (ref 0.1–1.0)
NEUTROS ABS: 4.9 10*3/uL (ref 1.4–7.7)
Neutrophils Relative %: 54.1 % (ref 43.0–77.0)
Platelets: 356 10*3/uL (ref 150.0–400.0)
RBC: 4.44 Mil/uL (ref 3.87–5.11)
RDW: 14.6 % (ref 11.5–15.5)
WBC: 9.1 10*3/uL (ref 4.0–10.5)

## 2017-08-20 LAB — BASIC METABOLIC PANEL
BUN: 8 mg/dL (ref 6–23)
CALCIUM: 9.7 mg/dL (ref 8.4–10.5)
CHLORIDE: 98 meq/L (ref 96–112)
CO2: 32 meq/L (ref 19–32)
Creatinine, Ser: 1.03 mg/dL (ref 0.40–1.20)
GFR: 74.46 mL/min (ref >60.00)
Glucose, Bld: 98 mg/dL (ref 70–99)
POTASSIUM: 3 meq/L — AB (ref 3.5–5.1)
SODIUM: 138 meq/L (ref 135–145)

## 2017-08-20 LAB — IBC PANEL
IRON: 50 ug/dL (ref 42–145)
Saturation Ratios: 10 % — ABNORMAL LOW (ref 20.0–50.0)
TRANSFERRIN: 357 mg/dL (ref 212.0–360.0)

## 2017-08-20 MED ORDER — NEBIVOLOL HCL 10 MG PO TABS: 10.0000 mg | ORAL_TABLET | Freq: Every day | ORAL | 0 refills | Status: DC

## 2017-08-20 NOTE — Patient Instructions (Signed)

## 2017-08-20 NOTE — Progress Notes (Signed)
Subjective:  Patient ID: Penny Ramirez, female    DOB: 23-Jun-1970  Age: 47 y.o. MRN: [id number]  CC: Anemia and Hypertension   HPI DELANDA BULLUCK presents for f/up - she is concerned that her blood pressure has not been well controlled.  She is taking a low-dose vitamin D supplement and has been compliant with chlorthalidone.  She has had a few episodes of fatigue but she denies headache, blurred vision, chest pain, shortness of breath, or edema.  She is taking an over-the-counter iron supplement.  She says her menstrual cycles only last 3 days and the bleeding is very moderate.  Outpatient Medications Prior to Visit  Medication Sig Dispense Refill  . chlorthalidone (HYGROTON) 25 MG tablet Take 1 tablet (25 mg total) by mouth daily. 90 tablet 0  . Insulin Pen Needle (NOVOFINE) 32G X 6 MM MISC 1 Act by Does not apply route daily. 1 ACT QD 100 each 1  . Liraglutide -Weight Management (SAXENDA) 18 MG/3ML SOPN Inject 3 mg into the skin daily. 5 pen 5   No facility-administered medications prior to visit.     ROS Review of Systems  Constitutional: Positive for fatigue. Negative for diaphoresis and unexpected weight change.  HENT: Negative.   Eyes: Negative for pain and visual disturbance.  Respiratory: Negative for apnea, cough, chest tightness, shortness of breath and wheezing.   Cardiovascular: Negative for chest pain, palpitations and leg swelling.  Gastrointestinal: Negative for abdominal pain, constipation, diarrhea, nausea and vomiting.  Genitourinary: Negative.  Negative for difficulty urinating.  Musculoskeletal: Negative.  Negative for arthralgias and myalgias.  Skin: Negative.  Negative for color change.  Neurological: Negative.  Negative for dizziness, weakness, light-headedness and headaches.  Hematological: Negative for adenopathy. Does not bruise/bleed easily.  Psychiatric/Behavioral: Negative.     Objective:  BP (!) 144/90 (BP Location: Left Arm, Patient Position:  Sitting, Cuff Size: Large)   Pulse 97   Temp 98.8 F (37.1 C) (Oral)   Resp 16   Ht 5' 4"  (1.626 m)   Wt 229 lb (103.9 kg)   LMP 08/02/2017   SpO2 99%   BMI 39.31 kg/m   BP Readings from Last 3 Encounters:  08/20/17 (!) 144/90  05/22/17 (!) 144/94  05/15/16 118/78    Wt Readings from Last 3 Encounters:  08/20/17 229 lb (103.9 kg)  05/22/17 234 lb (106.1 kg)  05/15/16 231 lb (104.8 kg)    Physical Exam  Constitutional: She is oriented to person, place, and time. No distress.  HENT:  Mouth/Throat: Oropharynx is clear and moist. No oropharyngeal exudate.  Eyes: Conjunctivae are normal. No scleral icterus.  Neck: Normal range of motion. Neck supple. No JVD present. No thyromegaly present.  Cardiovascular: Normal rate, regular rhythm and normal heart sounds.  No murmur heard. Pulmonary/Chest: Effort normal and breath sounds normal. She has no decreased breath sounds. She has no wheezes. She has no rhonchi. She has no rales.  Abdominal: Soft. Bowel sounds are normal. She exhibits no mass. There is no hepatosplenomegaly. There is no tenderness.  Musculoskeletal: Normal range of motion. She exhibits no edema, tenderness or deformity.  Lymphadenopathy:    She has no cervical adenopathy.  Neurological: She is alert and oriented to person, place, and time.  Skin: Skin is warm and dry. She is not diaphoretic. No pallor.  Vitals reviewed.   Lab Results  Component Value Date   WBC 9.1 08/20/2017   HGB 12.9 08/20/2017   HCT 38.7 08/20/2017   PLT  356.0 08/20/2017   GLUCOSE 98 08/20/2017   CHOL 167 05/22/2017   TRIG 73.0 05/22/2017   HDL 45.00 05/22/2017   LDLCALC 108 (H) 05/22/2017   ALT 11 05/22/2017   AST 13 05/22/2017   NA 138 08/20/2017   K 3.0 (L) 08/20/2017   CL 98 08/20/2017   CREATININE 1.03 08/20/2017   BUN 8 08/20/2017   CO2 32 08/20/2017   TSH 1.35 05/22/2017    No results found.  Assessment & Plan:   Nona was seen today for anemia and  hypertension.  Diagnoses and all orders for this visit:  Essential hypertension, benign- Her blood pressure is not well controlled and she has developed hypokalemia.  I will screen her for primary aldosteronism.  Will also stop the thiazide diuretic and will change to spironolactone.  I will also add nebivolol.  Finally, I will treat her vitamin D deficiency. -     Aldosterone + renin activity w/ ratio; Future -     VITAMIN D 25 Hydroxy (Vit-D Deficiency, Fractures); Future -     Basic metabolic panel; Future -     nebivolol (BYSTOLIC) 10 MG tablet; Take 1 tablet (10 mg total) by mouth daily. -     spironolactone (ALDACTONE) 25 MG tablet; Take 1 tablet (25 mg total) by mouth daily.  Iron deficiency anemia due to chronic blood loss- Her H&H are normal now.  Her iron level is in the low normal range.  I have asked her to continue taking the iron supplement. -     CBC with Differential/Platelet; Future -     IBC panel; Future -     Ferritin; Future  Hypokalemia- As above -     Aldosterone + renin activity w/ ratio; Future -     Basic metabolic panel; Future -     spironolactone (ALDACTONE) 25 MG tablet; Take 1 tablet (25 mg total) by mouth daily.  Vitamin D insufficiency -     Cholecalciferol 50000 units capsule; Take 1 capsule (50,000 Units total) by mouth once a week.   I have discontinued Jetaime K. Yellowhair's chlorthalidone, Liraglutide -Weight Management, and Insulin Pen Needle. I am also having her start on nebivolol, spironolactone, and Cholecalciferol.  Meds ordered this encounter  Medications  . nebivolol (BYSTOLIC) 10 MG tablet    Sig: Take 1 tablet (10 mg total) by mouth daily.    Dispense:  84 tablet    Refill:  0  . spironolactone (ALDACTONE) 25 MG tablet    Sig: Take 1 tablet (25 mg total) by mouth daily.    Dispense:  90 tablet    Refill:  1  . Cholecalciferol 50000 units capsule    Sig: Take 1 capsule (50,000 Units total) by mouth once a week.    Dispense:  12 capsule     Refill:  1     Follow-up: Return in about 3 months (around 11/20/2017).  [name], MD

## 2017-08-21 ENCOUNTER — Encounter: Payer: Self-pay | Admitting: Internal Medicine

## 2017-08-21 DIAGNOSIS — E559 Vitamin D deficiency, unspecified: Secondary | ICD-10-CM | POA: Insufficient documentation

## 2017-08-21 LAB — FERRITIN: Ferritin: 18.5 ng/mL (ref 10.0–291.0)

## 2017-08-21 LAB — VITAMIN D 25 HYDROXY (VIT D DEFICIENCY, FRACTURES): VITD: 30.69 ng/mL (ref 30.00–100.00)

## 2017-08-21 MED ORDER — CHOLECALCIFEROL 1.25 MG (50000 UT) PO CAPS: 50000.0000 [IU] | ORAL_CAPSULE | ORAL | 1 refills | Status: DC

## 2017-08-21 MED ORDER — SPIRONOLACTONE 25 MG PO TABS: 25.0000 mg | ORAL_TABLET | Freq: Every day | ORAL | 1 refills | Status: DC

## 2017-08-21 MED FILL — SPIRONOLACTONE 25 MG TABLET: 25 | 90 days supply | Qty: 90 | Fill #0

## 2017-08-21 MED FILL — VIT D3-50 50,000 UNITS CAPS: 1.25 MG | 84 days supply | Qty: 12 | Fill #0

## 2017-08-24 LAB — ALDOSTERONE + RENIN ACTIVITY W/ RATIO
ALDO / PRA Ratio: 6.2 Ratio (ref 0.9–28.9)
ALDOSTERONE: 19 ng/dL
Renin Activity: 3.08 ng/mL/h (ref 0.25–5.82)

## 2017-10-31 ENCOUNTER — Other Ambulatory Visit: Payer: Self-pay | Admitting: Obstetrics and Gynecology

## 2017-10-31 DIAGNOSIS — Z1231 Encounter for screening mammogram for malignant neoplasm of breast: Secondary | ICD-10-CM

## 2017-11-05 MED FILL — VIT D3-50 50,000 UNITS CAPS: 1.25 MG | 84 days supply | Qty: 12 | Fill #1

## 2017-11-05 MED FILL — SPIRONOLACTONE 25 MG TABLET: 25 | 90 days supply | Qty: 90 | Fill #1

## 2017-12-10 ENCOUNTER — Ambulatory Visit
Admission: RE | Admit: 2017-12-10 | Discharge: 2017-12-10 | Disposition: A | Discharge status: Home / Self Care | Payer: 59 | Source: Ambulatory Visit | Attending: Obstetrics and Gynecology | Admitting: Obstetrics and Gynecology

## 2017-12-10 DIAGNOSIS — Z1231 Encounter for screening mammogram for malignant neoplasm of breast: Secondary | ICD-10-CM | POA: Diagnosis not present

## 2017-12-31 ENCOUNTER — Other Ambulatory Visit: Payer: Self-pay | Admitting: Obstetrics and Gynecology

## 2017-12-31 ENCOUNTER — Other Ambulatory Visit (HOSPITAL_COMMUNITY)
Admission: RE | Admit: 2017-12-31 | Discharge: 2017-12-31 | Disposition: A | Discharge status: Home / Self Care | Payer: 59 | Source: Ambulatory Visit | Attending: Obstetrics and Gynecology | Admitting: Obstetrics and Gynecology

## 2017-12-31 DIAGNOSIS — Z01419 Encounter for gynecological examination (general) (routine) without abnormal findings: Secondary | ICD-10-CM | POA: Diagnosis not present

## 2017-12-31 DIAGNOSIS — B977 Papillomavirus as the cause of diseases classified elsewhere: Secondary | ICD-10-CM | POA: Diagnosis not present

## 2018-01-01 LAB — CYTOLOGY - PAP
DIAGNOSIS: NEGATIVE
HPV: NOT DETECTED

## 2018-02-11 ENCOUNTER — Encounter: Payer: Self-pay | Admitting: Internal Medicine

## 2018-02-11 ENCOUNTER — Other Ambulatory Visit (INDEPENDENT_AMBULATORY_CARE_PROVIDER_SITE_OTHER): Payer: No Typology Code available for payment source

## 2018-02-11 ENCOUNTER — Ambulatory Visit (INDEPENDENT_AMBULATORY_CARE_PROVIDER_SITE_OTHER): Payer: No Typology Code available for payment source | Admitting: Internal Medicine

## 2018-02-11 VITALS — BP 160/96 | HR 92 | Temp 98.5°F | Ht 64.0 in | Wt 231.0 lb

## 2018-02-11 DIAGNOSIS — E876 Hypokalemia: Secondary | ICD-10-CM | POA: Diagnosis not present

## 2018-02-11 DIAGNOSIS — E559 Vitamin D deficiency, unspecified: Secondary | ICD-10-CM

## 2018-02-11 DIAGNOSIS — I10 Essential (primary) hypertension: Secondary | ICD-10-CM

## 2018-02-11 DIAGNOSIS — D500 Iron deficiency anemia secondary to blood loss (chronic): Secondary | ICD-10-CM | POA: Diagnosis not present

## 2018-02-11 LAB — CBC WITH DIFFERENTIAL/PLATELET
Basophils Absolute: 0.1 10*3/uL (ref 0.0–0.1)
Basophils Relative: 0.6 % (ref 0.0–3.0)
Eosinophils Absolute: 0.6 10*3/uL (ref 0.0–0.7)
Eosinophils Relative: 6.5 % — ABNORMAL HIGH (ref 0.0–5.0)
HCT: 38.5 % (ref 36.0–46.0)
Hemoglobin: 12.9 g/dL (ref 12.0–15.0)
Lymphocytes Relative: 36.3 % (ref 12.0–46.0)
Lymphs Abs: 3.2 10*3/uL (ref 0.7–4.0)
MCHC: 33.4 g/dL (ref 30.0–36.0)
MCV: 89.9 fl (ref 78.0–100.0)
Monocytes Absolute: 0.7 10*3/uL (ref 0.1–1.0)
Monocytes Relative: 8.3 % (ref 3.0–12.0)
NEUTROS ABS: 4.2 10*3/uL (ref 1.4–7.7)
Neutrophils Relative %: 48.3 % (ref 43.0–77.0)
PLATELETS: 338 10*3/uL (ref 150.0–400.0)
RBC: 4.29 Mil/uL (ref 3.87–5.11)
RDW: 13.9 % (ref 11.5–15.5)
WBC: 8.8 10*3/uL (ref 4.0–10.5)

## 2018-02-11 LAB — IBC PANEL
Iron: 53 ug/dL (ref 42–145)
Saturation Ratios: 11.4 % — ABNORMAL LOW (ref 20.0–50.0)
Transferrin: 332 mg/dL (ref 212.0–360.0)

## 2018-02-11 LAB — BASIC METABOLIC PANEL
BUN: 11 mg/dL (ref 6–23)
CO2: 29 mEq/L (ref 19–32)
Calcium: 9.6 mg/dL (ref 8.4–10.5)
Chloride: 103 mEq/L (ref 96–112)
Creatinine, Ser: 0.96 mg/dL (ref 0.40–1.20)
GFR: 80.59 mL/min (ref >60.00)
Glucose, Bld: 92 mg/dL (ref 70–99)
POTASSIUM: 3.8 meq/L (ref 3.5–5.1)
Sodium: 138 mEq/L (ref 135–145)

## 2018-02-11 LAB — FERRITIN: Ferritin: 27.8 ng/mL (ref 10.0–291.0)

## 2018-02-11 LAB — MAGNESIUM: Magnesium: 1.7 mg/dL (ref 1.5–2.5)

## 2018-02-11 LAB — VITAMIN D 25 HYDROXY (VIT D DEFICIENCY, FRACTURES): VITD: 65.28 ng/mL (ref 30.00–100.00)

## 2018-02-11 MED ORDER — TRIAMTERENE-HCTZ 37.5-25 MG PO TABS: 1.0000 | ORAL_TABLET | Freq: Every day | ORAL | 0 refills | Status: DC

## 2018-02-11 MED ORDER — CHOLECALCIFEROL 1.25 MG (50000 UT) PO CAPS: 50000.0000 [IU] | ORAL_CAPSULE | ORAL | 1 refills | Status: DC

## 2018-02-11 MED FILL — TRIAMTERENE/HCTZ 37.5/25 TB: 37.5-25 | 90 days supply | Qty: 90 | Fill #0

## 2018-02-11 MED FILL — VIT D3-50 50,000 UNITS CAPS: 1.25 MG | 84 days supply | Qty: 12 | Fill #0

## 2018-02-11 NOTE — Patient Instructions (Signed)

## 2018-02-11 NOTE — Progress Notes (Signed)
Subjective:  Patient ID: Penny Ramirez, female    DOB: 1970/02/15  Age: 48 y.o. MRN: [id number]  CC: Hypertension and Anemia   HPI Penny Ramirez presents for f/up - She is concerned that her blood pressure is not adequately well controlled.  She stopped taking nebivolol because she thought it made her ankles swell.  She is taking spironolactone.  She has been working on her lifestyle modifications yet she has gained weight since I last saw her.  Outpatient Medications Prior to Visit  Medication Sig Dispense Refill  . Cholecalciferol 50000 units capsule Take 1 capsule (50,000 Units total) by mouth once a week. 12 capsule 1  . spironolactone (ALDACTONE) 25 MG tablet Take 1 tablet (25 mg total) by mouth daily. 90 tablet 1  . nebivolol (BYSTOLIC) 10 MG tablet Take 1 tablet (10 mg total) by mouth daily. (Patient not taking: Reported on 02/11/2018) 84 tablet 0   No facility-administered medications prior to visit.     ROS Review of Systems  Constitutional: Positive for unexpected weight change. Negative for appetite change, diaphoresis and fatigue.  HENT: Negative.   Eyes: Negative.  Negative for visual disturbance.  Respiratory: Negative for apnea, cough, chest tightness, shortness of breath and wheezing.   Cardiovascular: Negative for chest pain, palpitations and leg swelling.  Gastrointestinal: Negative for abdominal pain, constipation, diarrhea, nausea and vomiting.  Genitourinary: Negative.  Negative for difficulty urinating, dysuria, hematuria and urgency.  Musculoskeletal: Negative.  Negative for arthralgias, back pain, myalgias and neck pain.  Skin: Negative.  Negative for color change and pallor.  Neurological: Negative.  Negative for dizziness, weakness, light-headedness and headaches.  Hematological: Negative for adenopathy. Does not bruise/bleed easily.  Psychiatric/Behavioral: Negative.     Objective:  BP (!) 160/96 (BP Location: Left Arm, Patient Position: Sitting,  Cuff Size: Large)   Pulse 92   Temp 98.5 F (36.9 C) (Oral)   Ht 5' 4"  (1.626 m)   Wt 231 lb (104.8 kg)   LMP 01/17/2018 (Exact Date)   SpO2 99%   BMI 39.65 kg/m   BP Readings from Last 3 Encounters:  02/11/18 (!) 160/96  08/20/17 (!) 144/90  05/22/17 (!) 144/94    Wt Readings from Last 3 Encounters:  02/11/18 231 lb (104.8 kg)  08/20/17 229 lb (103.9 kg)  05/22/17 234 lb (106.1 kg)    Physical Exam Vitals signs reviewed.  Constitutional:      Appearance: She is obese. She is not ill-appearing.  HENT:     Nose: Nose normal. No congestion.     Mouth/Throat:     Mouth: Mucous membranes are moist.     Pharynx: Oropharynx is clear. No oropharyngeal exudate or posterior oropharyngeal erythema.  Eyes:     General: No scleral icterus.    Conjunctiva/sclera: Conjunctivae normal.  Neck:     Musculoskeletal: Normal range of motion and neck supple.  Cardiovascular:     Rate and Rhythm: Normal rate and regular rhythm.     Heart sounds: Normal heart sounds. No murmur. No gallop.   Pulmonary:     Effort: Pulmonary effort is normal.     Breath sounds: No stridor. No wheezing, rhonchi or rales.  Abdominal:     General: Bowel sounds are normal.     Palpations: There is no hepatomegaly or mass.     Tenderness: There is no abdominal tenderness. There is no guarding.  Musculoskeletal: Normal range of motion.        General: No swelling or  tenderness.     Right lower leg: No edema.     Left lower leg: No edema.  Skin:    General: Skin is warm and dry.     Findings: No erythema or rash.  Neurological:     General: No focal deficit present.     Mental Status: She is oriented to person, place, and time. Mental status is at baseline.     Lab Results  Component Value Date   WBC 8.8 02/11/2018   HGB 12.9 02/11/2018   HCT 38.5 02/11/2018   PLT 338.0 02/11/2018   GLUCOSE 92 02/11/2018   CHOL 167 05/22/2017   TRIG 73.0 05/22/2017   HDL 45.00 05/22/2017   LDLCALC 108 (H)  05/22/2017   ALT 11 05/22/2017   AST 13 05/22/2017   NA 138 02/11/2018   K 3.8 02/11/2018   CL 103 02/11/2018   CREATININE 0.96 02/11/2018   BUN 11 02/11/2018   CO2 29 02/11/2018   TSH 1.35 05/22/2017    No results found.  Assessment & Plan:   [name] was seen today for hypertension and anemia.  Diagnoses and all orders for this visit:  Essential hypertension, benign- Her blood pressure is not adequately well controlled.  Her electrolytes and renal function are normal.  I have asked her to upgrade from spironolactone to a combination of triamterene and hydrochlorothiazide to control her blood pressure. -     Magnesium; Future -     Basic metabolic panel; Future -     triamterene-hydrochlorothiazide (MAXZIDE-25) 37.5-25 MG tablet; Take 1 tablet by mouth daily.  Hypokalemia- her potassium level is normal now. -     Magnesium; Future -     Basic metabolic panel; Future  Iron deficiency anemia due to chronic blood loss- Her H&H and iron levels are normal now. -     CBC with Differential/Platelet; Future -     IBC panel; Future -     Ferritin; Future  Vitamin D insufficiency- Her vitamin D level is in the normal range.  I have asked her to continue taking the current vitamin D supplement. -     Cholecalciferol 1.25 MG (50000 UT) capsule; Take 1 capsule (50,000 Units total) by mouth once a week. -     VITAMIN D 25 Hydroxy (Vit-D Deficiency, Fractures); Future   I have discontinued [name]'s nebivolol and spironolactone. I have also changed her Cholecalciferol. Additionally, I am having her start on triamterene-hydrochlorothiazide.  Meds ordered this encounter  Medications  . triamterene-hydrochlorothiazide (MAXZIDE-25) 37.5-25 MG tablet    Sig: Take 1 tablet by mouth daily.    Dispense:  90 tablet    Refill:  0  . Cholecalciferol 1.25 MG (50000 UT) capsule    Sig: Take 1 capsule (50,000 Units total) by mouth once a week.    Dispense:  12 capsule    Refill:  1      Follow-up: Return in about 2 months (around 04/12/2018).  [name], MD

## 2018-02-12 ENCOUNTER — Encounter: Payer: Self-pay | Admitting: Internal Medicine

## 2018-04-04 MED FILL — SM BLOOD PRESSURE MONITOR: 30 days supply | Qty: 1 | Fill #0

## 2018-04-17 ENCOUNTER — Other Ambulatory Visit: Payer: Self-pay | Admitting: Internal Medicine

## 2018-04-17 DIAGNOSIS — I10 Essential (primary) hypertension: Secondary | ICD-10-CM

## 2018-04-18 MED FILL — TRIAMTERENE/HCTZ 37.5/25 TB: 37.5-25 | 90 days supply | Qty: 90 | Fill #0

## 2018-06-19 ENCOUNTER — Encounter: Payer: Self-pay | Admitting: Internal Medicine

## 2018-06-19 ENCOUNTER — Other Ambulatory Visit: Payer: Self-pay | Admitting: Internal Medicine

## 2018-06-19 ENCOUNTER — Other Ambulatory Visit (INDEPENDENT_AMBULATORY_CARE_PROVIDER_SITE_OTHER): Payer: No Typology Code available for payment source

## 2018-06-19 ENCOUNTER — Ambulatory Visit (INDEPENDENT_AMBULATORY_CARE_PROVIDER_SITE_OTHER): Payer: No Typology Code available for payment source | Admitting: Internal Medicine

## 2018-06-19 ENCOUNTER — Other Ambulatory Visit: Payer: Self-pay

## 2018-06-19 VITALS — BP 164/100 | HR 90 | Temp 97.7°F | Resp 16 | Ht 64.0 in | Wt 235.2 lb

## 2018-06-19 DIAGNOSIS — Z0000 Encounter for general adult medical examination without abnormal findings: Secondary | ICD-10-CM

## 2018-06-19 DIAGNOSIS — I10 Essential (primary) hypertension: Secondary | ICD-10-CM | POA: Diagnosis not present

## 2018-06-19 DIAGNOSIS — E559 Vitamin D deficiency, unspecified: Secondary | ICD-10-CM

## 2018-06-19 LAB — BASIC METABOLIC PANEL
BUN: 8 mg/dL (ref 6–23)
CO2: 28 mEq/L (ref 19–32)
Calcium: 9.5 mg/dL (ref 8.4–10.5)
Chloride: 98 mEq/L (ref 96–112)
Creatinine, Ser: 0.98 mg/dL (ref 0.40–1.20)
GFR: 73.93 mL/min (ref >60.00)
Glucose, Bld: 91 mg/dL (ref 70–99)
Potassium: 3.5 mEq/L (ref 3.5–5.1)
Sodium: 135 mEq/L (ref 135–145)

## 2018-06-19 LAB — LIPID PANEL
Cholesterol: 205 mg/dL — ABNORMAL HIGH (ref 0–200)
HDL: 40.9 mg/dL (ref >39.00)
LDL Cholesterol: 143 mg/dL — ABNORMAL HIGH (ref 0–99)
NonHDL: 164.51
Total CHOL/HDL Ratio: 5
Triglycerides: 107 mg/dL (ref 0.0–149.0)
VLDL: 21.4 mg/dL (ref 0.0–40.0)

## 2018-06-19 LAB — URINALYSIS, ROUTINE W REFLEX MICROSCOPIC
Bilirubin Urine: NEGATIVE
Ketones, ur: NEGATIVE
Leukocytes,Ua: NEGATIVE
Nitrite: NEGATIVE
Specific Gravity, Urine: <=1.005 — AB (ref 1.000–1.030)
Total Protein, Urine: NEGATIVE
Urine Glucose: NEGATIVE
Urobilinogen, UA: 0.2 (ref 0.0–1.0)
pH: 7 (ref 5.0–8.0)

## 2018-06-19 LAB — TSH: TSH: 1.15 u[IU]/mL (ref 0.35–4.50)

## 2018-06-19 LAB — VITAMIN D 25 HYDROXY (VIT D DEFICIENCY, FRACTURES): VITD: 79.13 ng/mL (ref 30.00–100.00)

## 2018-06-19 MED ORDER — CARVEDILOL 6.25 MG PO TABS: 6.2500 mg | ORAL_TABLET | Freq: Two times a day (BID) | ORAL | 0 refills | Status: DC

## 2018-06-19 NOTE — Progress Notes (Signed)
Subjective:  Patient ID: Penny Ramirez, female    DOB: 25-Feb-1970  Age: 48 y.o. MRN: [id number]  CC: Hypertension and Annual Exam   HPI Hadlee Burback presents for a CPX.  She tells me she has been compliant with the antihypertensive.  She has, however, not been working on her lifestyle modifications.  She denies any recent episodes of headache, blurred vision, chest pain, shortness of breath, palpitations, edema, or fatigue.  Outpatient Medications Prior to Visit  Medication Sig Dispense Refill  . Cholecalciferol 1.25 MG (50000 UT) capsule Take 1 capsule (50,000 Units total) by mouth once a week. 12 capsule 1  . triamterene-hydrochlorothiazide (MAXZIDE-25) 37.5-25 MG tablet TAKE 1 TABLET BY MOUTH DAILY. 90 tablet 0   No facility-administered medications prior to visit.     ROS Review of Systems  Constitutional: Positive for unexpected weight change (wt gain). Negative for appetite change, chills, diaphoresis and fatigue.  HENT: Negative.   Eyes: Negative for visual disturbance.  Respiratory: Negative for cough, chest tightness, shortness of breath and wheezing.   Cardiovascular: Negative for chest pain, palpitations and leg swelling.  Gastrointestinal: Negative for abdominal pain, constipation, diarrhea and vomiting.  Endocrine: Negative.   Genitourinary: Negative.  Negative for difficulty urinating.  Musculoskeletal: Negative.  Negative for arthralgias and myalgias.  Skin: Negative.  Negative for color change and pallor.  Neurological: Negative.  Negative for dizziness, weakness, light-headedness, numbness and headaches.  Hematological: Negative for adenopathy. Does not bruise/bleed easily.  Psychiatric/Behavioral: Negative.     Objective:  BP (!) 164/100 (BP Location: Left Arm, Patient Position: Sitting, Cuff Size: Large)   Pulse 90   Temp 97.7 F (36.5 C) (Oral)   Resp 16   Ht 5' 4"  (1.626 m)   Wt 235 lb 4 oz (106.7 kg)   SpO2 99%   BMI 40.38 kg/m   BP  Readings from Last 3 Encounters:  06/19/18 (!) 164/100  02/11/18 (!) 160/96  08/20/17 (!) 144/90    Wt Readings from Last 3 Encounters:  06/19/18 235 lb 4 oz (106.7 kg)  02/11/18 231 lb (104.8 kg)  08/20/17 229 lb (103.9 kg)    Physical Exam Constitutional:      Appearance: She is obese. She is not ill-appearing or diaphoretic.  HENT:     Nose: Nose normal.     Mouth/Throat:     Mouth: Mucous membranes are moist.  Eyes:     General: No scleral icterus.    Conjunctiva/sclera: Conjunctivae normal.  Neck:     Musculoskeletal: Normal range of motion and neck supple.  Cardiovascular:     Rate and Rhythm: Normal rate and regular rhythm.     Heart sounds: No murmur. No gallop.   Pulmonary:     Effort: Pulmonary effort is normal. No respiratory distress.     Breath sounds: No stridor. No wheezing, rhonchi or rales.  Abdominal:     General: Abdomen is flat. There is no distension.     Palpations: There is no hepatomegaly, splenomegaly or mass.     Tenderness: There is no abdominal tenderness. There is no guarding.  Musculoskeletal: Normal range of motion.        General: No swelling.     Right lower leg: No edema.     Left lower leg: No edema.  Lymphadenopathy:     Cervical: No cervical adenopathy.  Skin:    General: Skin is warm and dry.  Neurological:     General: No focal deficit present.  Mental Status: She is alert.  Psychiatric:        Mood and Affect: Mood normal.        Behavior: Behavior normal.     Lab Results  Component Value Date   WBC 8.8 02/11/2018   HGB 12.9 02/11/2018   HCT 38.5 02/11/2018   PLT 338.0 02/11/2018   GLUCOSE 91 06/19/2018   CHOL 205 (H) 06/19/2018   TRIG 107.0 06/19/2018   HDL 40.90 06/19/2018   LDLCALC 143 (H) 06/19/2018   ALT 11 05/22/2017   AST 13 05/22/2017   NA 135 06/19/2018   K 3.5 06/19/2018   CL 98 06/19/2018   CREATININE 0.98 06/19/2018   BUN 8 06/19/2018   CO2 28 06/19/2018   TSH 1.15 06/19/2018    No results  found.  Assessment & Plan:   Gara was seen today for hypertension and annual exam.  Diagnoses and all orders for this visit:  Essential hypertension, benign- Her BP is not adequately well controlled.  I have asked her to improve her lifestyle modifications and to try to be more compliant with the Maxide.  I have also asked her to add carvedilol to the diuretic regimen. -     Basic metabolic panel; Future -     TSH; Future -     Urinalysis, Routine w reflex microscopic; Future -     carvedilol (COREG) 6.25 MG tablet; Take 1 tablet (6.25 mg total) by mouth 2 (two) times daily with a meal. -     triamterene-hydrochlorothiazide (MAXZIDE-25) 37.5-25 MG tablet; Take 1 tablet by mouth daily.  Routine general medical examination at a health care facility- Exam completed, labs reviewed, her ASCVD risk or is less than 15% so I did not recommend a statin for CV risk reduction, her Pap and mammogram are up-to-date, patient education material was given. -     Lipid panel; Future  Vitamin D insufficiency- Her vitamin D level is in the high normal range so I have asked her to decrease her vitamin D dosage to 2000 international units once a day. -     VITAMIN D 25 Hydroxy (Vit-D Deficiency, Fractures); Future -     Cholecalciferol 50 MCG (2000 UT) TABS; Take 1 tablet (2,000 Units total) by mouth daily.  Severe obesity (BMI >= 40) ([place])- She was encouraged to improve her lifestyle modifications. -     TSH; Future   I have discontinued Ashlan K. Nurse's Cholecalciferol. I am also having her start on carvedilol and Cholecalciferol. Additionally, I am having her maintain her triamterene-hydrochlorothiazide.  Meds ordered this encounter  Medications  . carvedilol (COREG) 6.25 MG tablet    Sig: Take 1 tablet (6.25 mg total) by mouth 2 (two) times daily with a meal.    Dispense:  180 tablet    Refill:  0  . triamterene-hydrochlorothiazide (MAXZIDE-25) 37.5-25 MG tablet    Sig: Take 1 tablet by mouth  daily.    Dispense:  90 tablet    Refill:  0  . Cholecalciferol 50 MCG (2000 UT) TABS    Sig: Take 1 tablet (2,000 Units total) by mouth daily.    Dispense:  90 tablet    Refill:  1     Follow-up: Return in about 6 weeks (around 07/31/2018).  [name], MD

## 2018-06-19 NOTE — Patient Instructions (Signed)

## 2018-06-20 ENCOUNTER — Encounter: Payer: Self-pay | Admitting: Internal Medicine

## 2018-06-20 ENCOUNTER — Encounter: Payer: No Typology Code available for payment source | Admitting: Internal Medicine

## 2018-06-20 MED ORDER — TRIAMTERENE-HCTZ 37.5-25 MG PO TABS: 1.0000 | ORAL_TABLET | Freq: Every day | ORAL | 0 refills | Status: DC

## 2018-06-20 MED ORDER — CHOLECALCIFEROL 50 MCG (2000 UT) PO TABS: 1.0000 | ORAL_TABLET | Freq: Every day | ORAL | 1 refills | Status: DC

## 2018-07-22 MED FILL — TRIAMTERENE/HCTZ 37.5/25 TB: 37.5-25 | 90 days supply | Qty: 90 | Fill #0

## 2018-10-18 ENCOUNTER — Other Ambulatory Visit: Payer: Self-pay | Admitting: Internal Medicine

## 2018-10-18 DIAGNOSIS — E876 Hypokalemia: Secondary | ICD-10-CM

## 2018-10-18 DIAGNOSIS — I10 Essential (primary) hypertension: Secondary | ICD-10-CM

## 2018-10-20 ENCOUNTER — Other Ambulatory Visit: Payer: Self-pay | Admitting: Internal Medicine

## 2018-10-22 ENCOUNTER — Other Ambulatory Visit: Payer: Self-pay | Admitting: Internal Medicine

## 2018-10-22 DIAGNOSIS — I10 Essential (primary) hypertension: Secondary | ICD-10-CM

## 2018-10-22 MED FILL — TRIAMTERENE/HCTZ 37.5/25 TB: 37.5-25 | 90 days supply | Qty: 90 | Fill #0

## 2018-10-29 ENCOUNTER — Ambulatory Visit: Payer: No Typology Code available for payment source | Admitting: Internal Medicine

## 2018-11-06 ENCOUNTER — Other Ambulatory Visit: Payer: Self-pay | Admitting: Obstetrics and Gynecology

## 2018-11-06 DIAGNOSIS — Z1231 Encounter for screening mammogram for malignant neoplasm of breast: Secondary | ICD-10-CM

## 2018-12-17 ENCOUNTER — Ambulatory Visit
Admission: RE | Admit: 2018-12-17 | Discharge: 2018-12-17 | Disposition: A | Discharge status: Home / Self Care | Payer: No Typology Code available for payment source | Source: Ambulatory Visit | Attending: Obstetrics and Gynecology | Admitting: Obstetrics and Gynecology

## 2018-12-17 ENCOUNTER — Other Ambulatory Visit: Payer: Self-pay

## 2018-12-17 DIAGNOSIS — Z1231 Encounter for screening mammogram for malignant neoplasm of breast: Secondary | ICD-10-CM

## 2018-12-25 ENCOUNTER — Other Ambulatory Visit: Payer: Self-pay

## 2018-12-25 ENCOUNTER — Encounter: Payer: Self-pay | Admitting: Internal Medicine

## 2018-12-25 ENCOUNTER — Other Ambulatory Visit (INDEPENDENT_AMBULATORY_CARE_PROVIDER_SITE_OTHER): Payer: No Typology Code available for payment source

## 2018-12-25 ENCOUNTER — Ambulatory Visit (INDEPENDENT_AMBULATORY_CARE_PROVIDER_SITE_OTHER): Payer: No Typology Code available for payment source | Admitting: Internal Medicine

## 2018-12-25 VITALS — BP 142/92 | HR 90 | Temp 98.3°F | Resp 16 | Ht 64.0 in | Wt 235.0 lb

## 2018-12-25 DIAGNOSIS — I10 Essential (primary) hypertension: Secondary | ICD-10-CM

## 2018-12-25 DIAGNOSIS — E559 Vitamin D deficiency, unspecified: Secondary | ICD-10-CM

## 2018-12-25 DIAGNOSIS — E876 Hypokalemia: Secondary | ICD-10-CM

## 2018-12-25 DIAGNOSIS — T502X5A Adverse effect of carbonic-anhydrase inhibitors, benzothiadiazides and other diuretics, initial encounter: Secondary | ICD-10-CM | POA: Diagnosis not present

## 2018-12-25 LAB — BASIC METABOLIC PANEL
BUN: 9 mg/dL (ref 6–23)
CO2: 28 mEq/L (ref 19–32)
Calcium: 9.2 mg/dL (ref 8.4–10.5)
Chloride: 101 mEq/L (ref 96–112)
Creatinine, Ser: 0.93 mg/dL (ref 0.40–1.20)
GFR: 78.35 mL/min (ref >60.00)
Glucose, Bld: 135 mg/dL — ABNORMAL HIGH (ref 70–99)
Potassium: 3.1 mEq/L — ABNORMAL LOW (ref 3.5–5.1)
Sodium: 138 mEq/L (ref 135–145)

## 2018-12-25 LAB — CBC WITH DIFFERENTIAL/PLATELET
Basophils Absolute: 0 10*3/uL (ref 0.0–0.1)
Basophils Relative: 0.6 % (ref 0.0–3.0)
Eosinophils Absolute: 0.4 10*3/uL (ref 0.0–0.7)
Eosinophils Relative: 5.1 % — ABNORMAL HIGH (ref 0.0–5.0)
HCT: 39.5 % (ref 36.0–46.0)
Hemoglobin: 13.3 g/dL (ref 12.0–15.0)
Lymphocytes Relative: 34 % (ref 12.0–46.0)
Lymphs Abs: 2.4 10*3/uL (ref 0.7–4.0)
MCHC: 33.7 g/dL (ref 30.0–36.0)
MCV: 89.8 fl (ref 78.0–100.0)
Monocytes Absolute: 0.4 10*3/uL (ref 0.1–1.0)
Monocytes Relative: 6.3 % (ref 3.0–12.0)
Neutro Abs: 3.8 10*3/uL (ref 1.4–7.7)
Neutrophils Relative %: 54 % (ref 43.0–77.0)
Platelets: 343 10*3/uL (ref 150.0–400.0)
RBC: 4.4 Mil/uL (ref 3.87–5.11)
RDW: 13.5 % (ref 11.5–15.5)
WBC: 7 10*3/uL (ref 4.0–10.5)

## 2018-12-25 LAB — VITAMIN D 25 HYDROXY (VIT D DEFICIENCY, FRACTURES): VITD: 41.97 ng/mL (ref 30.00–100.00)

## 2018-12-25 MED ORDER — TRIAMTERENE-HCTZ 37.5-25 MG PO TABS: 1.0000 | ORAL_TABLET | Freq: Every day | ORAL | 0 refills | Status: DC

## 2018-12-25 MED ORDER — CHOLECALCIFEROL 50 MCG (2000 UT) PO TABS: 1.0000 | ORAL_TABLET | Freq: Every day | ORAL | 1 refills | Status: DC

## 2018-12-25 MED ORDER — POTASSIUM CHLORIDE CRYS ER 20 MEQ PO TBCR: 20.0000 meq | EXTENDED_RELEASE_TABLET | Freq: Every day | ORAL | 0 refills | Status: DC

## 2018-12-25 MED ORDER — CARVEDILOL 6.25 MG PO TABS: 6.2500 mg | ORAL_TABLET | Freq: Two times a day (BID) | ORAL | 0 refills | Status: DC

## 2018-12-25 NOTE — Progress Notes (Signed)
Subjective:  Patient ID: Penny Ramirez, female    DOB: Dec 03, 1970  Age: 48 y.o. MRN: [id number]  CC: Hypertension   HPI Penny Ramirez presents for f/up - She tells me that her blood pressure at home earlier today was 130/88.  She is aware that she gets psychically worked up when coming to the office and she believes there is a component of whitecoat phenomenon.  She thinks her blood pressure is well controlled on the current regimen.  She denies any recent episodes of headache, blurred vision, chest pain, shortness of breath, dizziness, or lightheadedness.  Outpatient Medications Prior to Visit  Medication Sig Dispense Refill  . carvedilol (COREG) 6.25 MG tablet Take 1 tablet (6.25 mg total) by mouth 2 (two) times daily with a meal. 180 tablet 0  . Cholecalciferol 50 MCG (2000 UT) TABS Take 1 tablet (2,000 Units total) by mouth daily. 90 tablet 1  . triamterene-hydrochlorothiazide (MAXZIDE-25) 37.5-25 MG tablet TAKE 1 TABLET BY MOUTH DAILY. 90 tablet 0   No facility-administered medications prior to visit.     ROS Review of Systems  Constitutional: Negative for diaphoresis, fatigue and unexpected weight change.  HENT: Negative.   Eyes: Negative for visual disturbance.  Respiratory: Negative for cough, chest tightness, shortness of breath and wheezing.   Cardiovascular: Negative for chest pain, palpitations and leg swelling.  Gastrointestinal: Negative for abdominal pain, constipation, diarrhea, nausea and vomiting.  Endocrine: Negative.   Genitourinary: Negative.  Negative for difficulty urinating.  Musculoskeletal: Negative.  Negative for back pain and myalgias.  Skin: Negative.  Negative for color change and pallor.  Neurological: Negative.  Negative for dizziness, weakness and light-headedness.  Hematological: Negative for adenopathy. Does not bruise/bleed easily.  Psychiatric/Behavioral: Negative.     Objective:  BP (!) 142/92 (BP Location: Left Arm, Patient Position:  Sitting, Cuff Size: Large)   Pulse 90   Temp 98.3 F (36.8 C) (Oral)   Resp 16   Ht 5' 4"  (1.626 m)   Wt 235 lb (106.6 kg)   SpO2 99%   BMI 40.34 kg/m   BP Readings from Last 3 Encounters:  12/25/18 (!) 142/92  06/19/18 (!) 164/100  02/11/18 (!) 160/96    Wt Readings from Last 3 Encounters:  12/25/18 235 lb (106.6 kg)  06/19/18 235 lb 4 oz (106.7 kg)  02/11/18 231 lb (104.8 kg)    Physical Exam Vitals signs reviewed.  Constitutional:      Appearance: She is obese.  HENT:     Nose: Nose normal.     Mouth/Throat:     Mouth: Mucous membranes are moist.  Eyes:     General: No scleral icterus.    Conjunctiva/sclera: Conjunctivae normal.  Neck:     Musculoskeletal: Neck supple.  Cardiovascular:     Rate and Rhythm: Normal rate.     Heart sounds: No murmur.  Pulmonary:     Effort: Pulmonary effort is normal.     Breath sounds: No wheezing, rhonchi or rales.  Abdominal:     General: Abdomen is protuberant. Bowel sounds are normal. There is no distension.     Palpations: Abdomen is soft. There is no hepatomegaly or splenomegaly.     Tenderness: There is no abdominal tenderness.     Hernia: No hernia is present.  Musculoskeletal: Normal range of motion.     Right lower leg: No edema.     Left lower leg: No edema.  Lymphadenopathy:     Cervical: No cervical adenopathy.  Skin:    General: Skin is warm and dry.  Neurological:     General: No focal deficit present.     Mental Status: She is alert.  Psychiatric:        Mood and Affect: Mood normal.        Behavior: Behavior normal.     Lab Results  Component Value Date   WBC 7.0 12/25/2018   HGB 13.3 12/25/2018   HCT 39.5 12/25/2018   PLT 343.0 12/25/2018   GLUCOSE 135 (H) 12/25/2018   CHOL 205 (H) 06/19/2018   TRIG 107.0 06/19/2018   HDL 40.90 06/19/2018   LDLCALC 143 (H) 06/19/2018   ALT 11 05/22/2017   AST 13 05/22/2017   NA 138 12/25/2018   K 3.1 (L) 12/25/2018   CL 101 12/25/2018   CREATININE 0.93  12/25/2018   BUN 9 12/25/2018   CO2 28 12/25/2018   TSH 1.15 06/19/2018    Mm 3d Screen Breast Bilateral  Result Date: 12/18/2018 CLINICAL DATA:  Screening. EXAM: DIGITAL SCREENING BILATERAL MAMMOGRAM WITH TOMO AND CAD COMPARISON:  Previous exam(s). ACR Breast Density Category b: There are scattered areas of fibroglandular density. FINDINGS: There are no findings suspicious for malignancy. Images were processed with CAD. IMPRESSION: No mammographic evidence of malignancy. A result letter of this screening mammogram will be mailed directly to the patient. RECOMMENDATION: Screening mammogram in one year. (Code:SM-B-01Y) BI-RADS CATEGORY  1: Negative. Electronically Signed   By: Lajean Manes M.D.   On: 12/18/2018 12:12    Assessment & Plan:   Penny Ramirez was seen today for hypertension.  Diagnoses and all orders for this visit:  Essential hypertension, benign- I think her blood pressure is adequately well controlled.  I think the elevations that I see in the office are related to the whitecoat phenomenon.  I recommended that she continue the current combination of triamterene, hydrochlorothiazide, and carvedilol. -     CBC with Differential; Future -     Basic metabolic panel; Future -     potassium chloride SA (KLOR-CON) 20 MEQ tablet; Take 1 tablet (20 mEq total) by mouth daily. -     triamterene-hydrochlorothiazide (MAXZIDE-25) 37.5-25 MG tablet; Take 1 tablet by mouth daily. -     carvedilol (COREG) 6.25 MG tablet; Take 1 tablet (6.25 mg total) by mouth 2 (two) times daily with a meal.  Vitamin D insufficiency- Her vitamin D level is in the low normal range.  I recommended that she continue taking a vitamin D supplement. -     Vitamin D 25 hydroxy; Future -     Cholecalciferol 50 MCG (2000 UT) TABS; Take 1 tablet (2,000 Units total) by mouth daily.  Diuretic-induced hypokalemia -     potassium chloride SA (KLOR-CON) 20 MEQ tablet; Take 1 tablet (20 mEq total) by mouth daily.   I am  having Penny Ramirez start on potassium chloride SA. I am also having her maintain her triamterene-hydrochlorothiazide, carvedilol, and Cholecalciferol.  Meds ordered this encounter  Medications  . potassium chloride SA (KLOR-CON) 20 MEQ tablet    Sig: Take 1 tablet (20 mEq total) by mouth daily.    Dispense:  90 tablet    Refill:  0  . triamterene-hydrochlorothiazide (MAXZIDE-25) 37.5-25 MG tablet    Sig: Take 1 tablet by mouth daily.    Dispense:  90 tablet    Refill:  0  . carvedilol (COREG) 6.25 MG tablet    Sig: Take 1 tablet (6.25 mg total) by mouth  2 (two) times daily with a meal.    Dispense:  180 tablet    Refill:  0  . Cholecalciferol 50 MCG (2000 UT) TABS    Sig: Take 1 tablet (2,000 Units total) by mouth daily.    Dispense:  90 tablet    Refill:  1     Follow-up: Return in about 4 months (around 04/24/2019).  [name], MD

## 2018-12-25 NOTE — Patient Instructions (Signed)

## 2019-01-02 ENCOUNTER — Other Ambulatory Visit: Payer: Self-pay | Admitting: Internal Medicine

## 2019-01-02 DIAGNOSIS — I10 Essential (primary) hypertension: Secondary | ICD-10-CM

## 2019-01-06 ENCOUNTER — Other Ambulatory Visit: Payer: Self-pay | Admitting: Internal Medicine

## 2019-01-06 MED ORDER — TRIAMTERENE-HCTZ 37.5-25 MG PO TABS: 1.0000 | ORAL_TABLET | Freq: Every day | ORAL | 0 refills | Status: DC

## 2019-01-06 MED ORDER — CARVEDILOL 6.25 MG PO TABS: 6.2500 mg | ORAL_TABLET | Freq: Two times a day (BID) | ORAL | 0 refills | Status: DC

## 2019-01-27 MED FILL — TRIAMTERENE-HCTZ 37.5-25 MG: 37.5-25 | 90 days supply | Qty: 90 | Fill #0

## 2019-01-27 MED FILL — POTASSIUM CHLORIDE CRYS ER: 20 | 90 days supply | Qty: 90 | Fill #0

## 2019-04-15 ENCOUNTER — Other Ambulatory Visit: Payer: Self-pay | Admitting: Internal Medicine

## 2019-04-15 DIAGNOSIS — I10 Essential (primary) hypertension: Secondary | ICD-10-CM

## 2019-04-17 MED ORDER — CARVEDILOL 6.25 MG PO TABS: 6.2500 mg | ORAL_TABLET | Freq: Two times a day (BID) | ORAL | 0 refills | Status: DC

## 2019-04-17 MED ORDER — TRIAMTERENE-HCTZ 37.5-25 MG PO TABS: 1.0000 | ORAL_TABLET | Freq: Every day | ORAL | 0 refills | Status: DC

## 2019-04-17 MED FILL — CARVEDILOL 6.25 MG TABLET: 6.25 | 90 days supply | Qty: 180 | Fill #0

## 2019-04-17 MED FILL — TRIAMTERENE/HCTZ 37.5/25 TB: 37.5-25 | 90 days supply | Qty: 90 | Fill #0

## 2019-08-06 ENCOUNTER — Encounter: Payer: Self-pay | Admitting: Internal Medicine

## 2019-08-06 ENCOUNTER — Other Ambulatory Visit: Payer: Self-pay

## 2019-08-06 ENCOUNTER — Ambulatory Visit (INDEPENDENT_AMBULATORY_CARE_PROVIDER_SITE_OTHER): Payer: No Typology Code available for payment source | Admitting: Internal Medicine

## 2019-08-06 VITALS — BP 160/90 | HR 86 | Temp 98.9°F | Resp 16 | Ht 64.0 in | Wt 236.0 lb

## 2019-08-06 DIAGNOSIS — I10 Essential (primary) hypertension: Secondary | ICD-10-CM

## 2019-08-06 DIAGNOSIS — R739 Hyperglycemia, unspecified: Secondary | ICD-10-CM | POA: Diagnosis not present

## 2019-08-06 DIAGNOSIS — E559 Vitamin D deficiency, unspecified: Secondary | ICD-10-CM

## 2019-08-06 DIAGNOSIS — Z1159 Encounter for screening for other viral diseases: Secondary | ICD-10-CM

## 2019-08-06 DIAGNOSIS — Z0000 Encounter for general adult medical examination without abnormal findings: Secondary | ICD-10-CM | POA: Diagnosis not present

## 2019-08-06 DIAGNOSIS — E876 Hypokalemia: Secondary | ICD-10-CM | POA: Diagnosis not present

## 2019-08-06 DIAGNOSIS — T502X5A Adverse effect of carbonic-anhydrase inhibitors, benzothiadiazides and other diuretics, initial encounter: Secondary | ICD-10-CM

## 2019-08-06 DIAGNOSIS — E785 Hyperlipidemia, unspecified: Secondary | ICD-10-CM

## 2019-08-06 MED ORDER — TRIAMTERENE-HCTZ 37.5-25 MG PO TABS: 1.0000 | ORAL_TABLET | Freq: Every day | ORAL | 0 refills | Status: DC

## 2019-08-06 MED ORDER — CARVEDILOL 12.5 MG PO TABS: 12.5000 mg | ORAL_TABLET | Freq: Two times a day (BID) | ORAL | 0 refills | Status: DC

## 2019-08-06 MED FILL — CARVEDILOL 12.5 MG TABLET: 12.5 | 90 days supply | Qty: 180 | Fill #0

## 2019-08-06 MED FILL — TRIAMTERENE-HCTZ 37.5-25 MG: 37.5-25 | 90 days supply | Qty: 90 | Fill #0

## 2019-08-06 NOTE — Progress Notes (Addendum)
Subjective:  Patient ID: Penny Ramirez, female    DOB: 09/22/1970  Age: 49 y.o. MRN: [id number]  CC: Annual Exam and Hypertension  This visit occurred during the SARS-CoV-2 public health emergency.  Safety protocols were in place, including screening questions prior to the visit, additional usage of staff PPE, and extensive cleaning of exam room while observing appropriate contact time as indicated for disinfecting solutions.    HPI Penny Ramirez presents for a CPX.  She tells me she is compliant with her antihypertensives but according to prescription refills she would have run out of them several weeks ago.  She does not monitor her blood pressure.  She is not had much success with lifestyle modifications.  She denies any recent episodes of headache, blurred vision, chest pain, shortness of breath, palpitations, edema, or fatigue.  Outpatient Medications Prior to Visit  Medication Sig Dispense Refill  . potassium chloride SA (KLOR-CON) 20 MEQ tablet Take 1 tablet (20 mEq total) by mouth daily. 90 tablet 0  . carvedilol (COREG) 6.25 MG tablet Take 1 tablet (6.25 mg total) by mouth 2 (two) times daily with a meal. 180 tablet 0  . triamterene-hydrochlorothiazide (MAXZIDE-25) 37.5-25 MG tablet Take 1 tablet by mouth daily. 90 tablet 0  . Cholecalciferol 50 MCG (2000 UT) TABS Take 1 tablet (2,000 Units total) by mouth daily. (Patient not taking: Reported on 08/06/2019) 90 tablet 1   No facility-administered medications prior to visit.    ROS Review of Systems  Constitutional: Negative.  Negative for diaphoresis and fatigue.  HENT: Negative.   Eyes: Negative for visual disturbance.  Respiratory: Negative for cough, chest tightness, shortness of breath and wheezing.   Cardiovascular: Negative for chest pain, palpitations and leg swelling.  Gastrointestinal: Negative for abdominal pain, constipation, diarrhea, nausea and vomiting.  Endocrine: Negative.   Genitourinary: Negative.   Negative for difficulty urinating, dysuria and hematuria.  Musculoskeletal: Negative.  Negative for arthralgias and myalgias.  Skin: Negative.   Neurological: Negative.  Negative for dizziness, weakness and light-headedness.  Hematological: Negative for adenopathy. Does not bruise/bleed easily.  Psychiatric/Behavioral: Negative.     Objective:  BP (!) 160/90 (BP Location: Right Arm, Patient Position: Sitting, Cuff Size: Large)   Pulse 86   Temp 98.9 F (37.2 C) (Oral)   Resp 16   Ht 5' 4"  (1.626 m)   Wt 236 lb (107 kg)   SpO2 98%   BMI 40.51 kg/m   BP Readings from Last 3 Encounters:  08/06/19 (!) 160/90  12/25/18 (!) 142/92  06/19/18 (!) 164/100    Wt Readings from Last 3 Encounters:  08/06/19 236 lb (107 kg)  12/25/18 235 lb (106.6 kg)  06/19/18 235 lb 4 oz (106.7 kg)    Physical Exam Vitals reviewed.  Constitutional:      Appearance: She is obese.  HENT:     Nose: Nose normal.     Mouth/Throat:     Mouth: Mucous membranes are moist.  Eyes:     General: No scleral icterus.    Conjunctiva/sclera: Conjunctivae normal.  Cardiovascular:     Rate and Rhythm: Normal rate and regular rhythm.     Heart sounds: No murmur heard.   Pulmonary:     Effort: Pulmonary effort is normal.     Breath sounds: No stridor. No wheezing, rhonchi or rales.  Abdominal:     General: Abdomen is protuberant. Bowel sounds are normal. There is no distension.     Palpations: Abdomen is soft. There  is no hepatomegaly, splenomegaly or mass.     Tenderness: There is no abdominal tenderness.  Musculoskeletal:        General: Normal range of motion.     Cervical back: Neck supple.     Right lower leg: No edema.     Left lower leg: No edema.  Lymphadenopathy:     Cervical: No cervical adenopathy.  Skin:    General: Skin is warm and dry.     Coloration: Skin is not jaundiced or pale.  Neurological:     General: No focal deficit present.     Mental Status: She is alert and oriented to  person, place, and time. Mental status is at baseline.  Psychiatric:        Mood and Affect: Mood normal.        Behavior: Behavior normal.     Lab Results  Component Value Date   WBC 7.0 08/06/2019   HGB 13.0 08/06/2019   HCT 39.6 08/06/2019   PLT 345 08/06/2019   GLUCOSE 96 08/06/2019   CHOL 209 (H) 08/06/2019   TRIG 100 08/06/2019   HDL 40 (L) 08/06/2019   LDLCALC 148 (H) 08/06/2019   ALT 17 08/06/2019   AST 16 08/06/2019   NA 140 08/06/2019   K 4.2 08/06/2019   CL 106 08/06/2019   CREATININE 1.03 08/06/2019   BUN 10 08/06/2019   CO2 27 08/06/2019   TSH 1.15 06/19/2018   HGBA1C 5.9 (H) 08/06/2019    MM 3D SCREEN BREAST BILATERAL  Result Date: 12/18/2018 CLINICAL DATA:  Screening. EXAM: DIGITAL SCREENING BILATERAL MAMMOGRAM WITH TOMO AND CAD COMPARISON:  Previous exam(s). ACR Breast Density Category b: There are scattered areas of fibroglandular density. FINDINGS: There are no findings suspicious for malignancy. Images were processed with CAD. IMPRESSION: No mammographic evidence of malignancy. A result letter of this screening mammogram will be mailed directly to the patient. RECOMMENDATION: Screening mammogram in one year. (Code:SM-B-01Y) BI-RADS CATEGORY  1: Negative. Electronically Signed   By: Lajean Manes M.D.   On: 12/18/2018 12:12    Assessment & Plan:   Ernesteen was seen today for annual exam and hypertension.  Diagnoses and all orders for this visit:  Essential hypertension, benign- Her blood pressure is not adequately well controlled.  She agrees to improve her lifestyle modifications.  I am concerned there is some noncompliance but she said she is compliant with her current regimen so we will continue the current dose of Maxide but will increase the dose of carvedilol. -     CBC with Differential/Platelet; Future -     Basic metabolic panel; Future -     Hepatic function panel; Future -     carvedilol (COREG) 12.5 MG tablet; Take 1 tablet (12.5 mg total) by  mouth 2 (two) times daily with a meal. -     triamterene-hydrochlorothiazide (MAXZIDE-25) 37.5-25 MG tablet; Take 1 tablet by mouth daily. -     Hepatic function panel -     Basic metabolic panel -     CBC with Differential/Platelet  Diuretic-induced hypokalemia- Her potassium level is normal now. -     Magnesium; Future -     Magnesium  Vitamin D insufficiency- Her vitamin D level is normal now. -     VITAMIN D 25 Hydroxy (Vit-D Deficiency, Fractures); Future -     VITAMIN D 25 Hydroxy (Vit-D Deficiency, Fractures)  Routine general medical examination at a health care facility- Exam completed, labs reviewed - her  ASCVD risk score is moderately elevated so will start a statin for CV risk reduction, vaccines reviewed and updated, Cologuard ordered to screen for colon cancer/polyps, other cancer screenings are up-to-date, patient education material was given. -     Lipid panel; Future -     Lipid panel  Hyperglycemia- Her A1c is at 5.9%.  Medical therapy is not indicated. -     Basic metabolic panel; Future -     Hemoglobin A1c; Future -     Hemoglobin A1c -     Basic metabolic panel  Need for hepatitis C screening test -     Hepatitis C antibody; Future -     Hepatitis C antibody   I have discontinued Vicktoria K. Benham's Cholecalciferol and carvedilol. I am also having her start on carvedilol. Additionally, I am having her maintain her potassium chloride SA and triamterene-hydrochlorothiazide.  Meds ordered this encounter  Medications  . carvedilol (COREG) 12.5 MG tablet    Sig: Take 1 tablet (12.5 mg total) by mouth 2 (two) times daily with a meal.    Dispense:  180 tablet    Refill:  0  . triamterene-hydrochlorothiazide (MAXZIDE-25) 37.5-25 MG tablet    Sig: Take 1 tablet by mouth daily.    Dispense:  90 tablet    Refill:  0     Follow-up: Return in about 3 months (around 11/06/2019).  [name], MD

## 2019-08-06 NOTE — Patient Instructions (Signed)

## 2019-08-07 ENCOUNTER — Encounter: Payer: Self-pay | Admitting: Internal Medicine

## 2019-08-07 LAB — CBC WITH DIFFERENTIAL/PLATELET
Absolute Monocytes: 539 cells/uL (ref 200–950)
Basophils Absolute: 49 cells/uL (ref 0–200)
Basophils Relative: 0.7 %
Eosinophils Absolute: 280 cells/uL (ref 15–500)
Eosinophils Relative: 4 %
HCT: 39.6 % (ref 35.0–45.0)
Hemoglobin: 13 g/dL (ref 11.7–15.5)
Lymphs Abs: 2485 cells/uL (ref 850–3900)
MCH: 29.4 pg (ref 27.0–33.0)
MCHC: 32.8 g/dL (ref 32.0–36.0)
MCV: 89.6 fL (ref 80.0–100.0)
MPV: 10.2 fL (ref 7.5–12.5)
Monocytes Relative: 7.7 %
Neutro Abs: 3647 cells/uL (ref 1500–7800)
Neutrophils Relative %: 52.1 %
Platelets: 345 10*3/uL (ref 140–400)
RBC: 4.42 10*6/uL (ref 3.80–5.10)
RDW: 12.4 % (ref 11.0–15.0)
Total Lymphocyte: 35.5 %
WBC: 7 10*3/uL (ref 3.8–10.8)

## 2019-08-07 LAB — VITAMIN D 25 HYDROXY (VIT D DEFICIENCY, FRACTURES): Vit D, 25-Hydroxy: 31 ng/mL (ref 30–100)

## 2019-08-07 LAB — BASIC METABOLIC PANEL
BUN: 10 mg/dL (ref 7–25)
CO2: 27 mmol/L (ref 20–32)
Calcium: 9.2 mg/dL (ref 8.6–10.2)
Chloride: 106 mmol/L (ref 98–110)
Creat: 1.03 mg/dL (ref 0.50–1.10)
Glucose, Bld: 96 mg/dL (ref 65–99)
Potassium: 4.2 mmol/L (ref 3.5–5.3)
Sodium: 140 mmol/L (ref 135–146)

## 2019-08-07 LAB — MAGNESIUM: Magnesium: 1.9 mg/dL (ref 1.5–2.5)

## 2019-08-07 LAB — LIPID PANEL
Cholesterol: 209 mg/dL — ABNORMAL HIGH (ref <200)
HDL: 40 mg/dL — ABNORMAL LOW (ref >=50)
LDL Cholesterol (Calc): 148 mg/dL (calc) — ABNORMAL HIGH
Non-HDL Cholesterol (Calc): 169 mg/dL (calc) — ABNORMAL HIGH (ref <130)
Total CHOL/HDL Ratio: 5.2 (calc) — ABNORMAL HIGH (ref <5.0)
Triglycerides: 100 mg/dL (ref <150)

## 2019-08-07 LAB — HEPATIC FUNCTION PANEL
AG Ratio: 1.3 (calc) (ref 1.0–2.5)
ALT: 17 U/L (ref 6–29)
AST: 16 U/L (ref 10–35)
Albumin: 3.8 g/dL (ref 3.6–5.1)
Alkaline phosphatase (APISO): 78 U/L (ref 31–125)
Bilirubin, Direct: 0.1 mg/dL (ref 0.0–0.2)
Globulin: 2.9 g/dL (calc) (ref 1.9–3.7)
Indirect Bilirubin: 0.3 mg/dL (calc) (ref 0.2–1.2)
Total Bilirubin: 0.4 mg/dL (ref 0.2–1.2)
Total Protein: 6.7 g/dL (ref 6.1–8.1)

## 2019-08-07 LAB — HEPATITIS C ANTIBODY
Hepatitis C Ab: NONREACTIVE
SIGNAL TO CUT-OFF: 0.03 (ref <1.00)

## 2019-08-07 LAB — HEMOGLOBIN A1C
Hgb A1c MFr Bld: 5.9 % of total Hgb — ABNORMAL HIGH (ref <5.7)
Mean Plasma Glucose: 123 (calc)
eAG (mmol/L): 6.8 (calc)

## 2019-08-08 ENCOUNTER — Other Ambulatory Visit: Payer: Self-pay

## 2019-08-08 DIAGNOSIS — Z1211 Encounter for screening for malignant neoplasm of colon: Secondary | ICD-10-CM

## 2019-08-09 DIAGNOSIS — Z1159 Encounter for screening for other viral diseases: Secondary | ICD-10-CM | POA: Insufficient documentation

## 2019-08-09 DIAGNOSIS — E785 Hyperlipidemia, unspecified: Secondary | ICD-10-CM | POA: Insufficient documentation

## 2019-08-09 MED ORDER — ROSUVASTATIN CALCIUM 5 MG PO TABS: 5.0000 mg | ORAL_TABLET | Freq: Every day | ORAL | 1 refills | Status: DC

## 2019-08-09 NOTE — Addendum Note (Signed)
Addended by: Janith Lima on: 08/09/2019 08:26 AM   Modules accepted: Orders

## 2019-08-11 MED FILL — ROSUVASTATIN CALCIUM 5 MG T: 5 | 90 days supply | Qty: 90 | Fill #0

## 2019-09-04 LAB — COLOGUARD
COLOGUARD: NEGATIVE
Cologuard: NEGATIVE

## 2019-09-04 LAB — EXTERNAL GENERIC LAB PROCEDURE: COLOGUARD: NEGATIVE

## 2019-09-05 ENCOUNTER — Encounter: Payer: Self-pay | Admitting: Internal Medicine

## 2019-11-03 ENCOUNTER — Other Ambulatory Visit: Payer: Self-pay | Admitting: Internal Medicine

## 2019-11-03 DIAGNOSIS — I10 Essential (primary) hypertension: Secondary | ICD-10-CM

## 2019-11-03 MED ORDER — TRIAMTERENE-HCTZ 37.5-25 MG PO TABS: 1.0000 | ORAL_TABLET | Freq: Every day | ORAL | 0 refills | Status: DC

## 2019-11-03 MED ORDER — CARVEDILOL 12.5 MG PO TABS: 12.5000 mg | ORAL_TABLET | Freq: Two times a day (BID) | ORAL | 0 refills | Status: DC

## 2019-11-03 MED FILL — CARVEDILOL 12.5 MG TABLET: 12.5 | 90 days supply | Qty: 180 | Fill #0

## 2019-11-03 MED FILL — TRIAMTERENE-HCTZ 37.5-25 MG: 37.5-25 | 90 days supply | Qty: 90 | Fill #0

## 2019-11-25 ENCOUNTER — Other Ambulatory Visit: Payer: Self-pay | Admitting: Obstetrics and Gynecology

## 2019-11-25 DIAGNOSIS — Z1231 Encounter for screening mammogram for malignant neoplasm of breast: Secondary | ICD-10-CM

## 2020-01-06 ENCOUNTER — Other Ambulatory Visit: Payer: Self-pay

## 2020-01-06 ENCOUNTER — Ambulatory Visit
Admission: RE | Admit: 2020-01-06 | Discharge: 2020-01-06 | Disposition: A | Discharge status: Home / Self Care | Payer: No Typology Code available for payment source | Source: Ambulatory Visit | Attending: Obstetrics and Gynecology | Admitting: Obstetrics and Gynecology

## 2020-01-06 DIAGNOSIS — Z1231 Encounter for screening mammogram for malignant neoplasm of breast: Secondary | ICD-10-CM

## 2020-02-24 ENCOUNTER — Other Ambulatory Visit: Payer: Self-pay

## 2020-02-25 ENCOUNTER — Other Ambulatory Visit: Payer: Self-pay | Admitting: Internal Medicine

## 2020-02-25 ENCOUNTER — Ambulatory Visit (INDEPENDENT_AMBULATORY_CARE_PROVIDER_SITE_OTHER): Payer: No Typology Code available for payment source | Admitting: Internal Medicine

## 2020-02-25 ENCOUNTER — Other Ambulatory Visit: Payer: Self-pay

## 2020-02-25 ENCOUNTER — Encounter: Payer: Self-pay | Admitting: Internal Medicine

## 2020-02-25 VITALS — BP 132/76 | HR 94 | Temp 98.5°F | Ht 64.0 in | Wt 220.0 lb

## 2020-02-25 DIAGNOSIS — I10 Essential (primary) hypertension: Secondary | ICD-10-CM | POA: Diagnosis not present

## 2020-02-25 LAB — BASIC METABOLIC PANEL
BUN: 10 mg/dL (ref 6–23)
CO2: 28 mEq/L (ref 19–32)
Calcium: 9.5 mg/dL (ref 8.4–10.5)
Chloride: 103 mEq/L (ref 96–112)
Creatinine, Ser: 1.05 mg/dL (ref 0.40–1.20)
GFR: 63.19 mL/min (ref >60.00)
Glucose, Bld: 101 mg/dL — ABNORMAL HIGH (ref 70–99)
Potassium: 4 mEq/L (ref 3.5–5.1)
Sodium: 137 mEq/L (ref 135–145)

## 2020-02-25 LAB — CBC WITH DIFFERENTIAL/PLATELET
Basophils Absolute: 0 10*3/uL (ref 0.0–0.1)
Basophils Relative: 0.7 % (ref 0.0–3.0)
Eosinophils Absolute: 0.3 10*3/uL (ref 0.0–0.7)
Eosinophils Relative: 4.1 % (ref 0.0–5.0)
HCT: 38.3 % (ref 36.0–46.0)
Hemoglobin: 12.8 g/dL (ref 12.0–15.0)
Lymphocytes Relative: 29.3 % (ref 12.0–46.0)
Lymphs Abs: 2.1 10*3/uL (ref 0.7–4.0)
MCHC: 33.5 g/dL (ref 30.0–36.0)
MCV: 88.7 fl (ref 78.0–100.0)
Monocytes Absolute: 0.7 10*3/uL (ref 0.1–1.0)
Monocytes Relative: 9.3 % (ref 3.0–12.0)
Neutro Abs: 4 10*3/uL (ref 1.4–7.7)
Neutrophils Relative %: 56.6 % (ref 43.0–77.0)
Platelets: 318 10*3/uL (ref 150.0–400.0)
RBC: 4.32 Mil/uL (ref 3.87–5.11)
RDW: 14.2 % (ref 11.5–15.5)
WBC: 7.1 10*3/uL (ref 4.0–10.5)

## 2020-02-25 MED ORDER — CARVEDILOL 12.5 MG PO TABS: 12.5000 mg | ORAL_TABLET | Freq: Two times a day (BID) | ORAL | 1 refills | Status: DC

## 2020-02-25 MED ORDER — TRIAMTERENE-HCTZ 37.5-25 MG PO TABS: 1.0000 | ORAL_TABLET | Freq: Every day | ORAL | 1 refills | Status: DC

## 2020-02-25 MED FILL — CARVEDILOL 12.5 MG TABLET: 12.5 | 90 days supply | Qty: 180 | Fill #0

## 2020-02-25 MED FILL — TRIAMTERENE-HCTZ 37.5-25 MG: 37.5-25 | 90 days supply | Qty: 90 | Fill #0

## 2020-02-25 NOTE — Patient Instructions (Signed)

## 2020-02-25 NOTE — Progress Notes (Signed)
Subjective:  Patient ID: Penny Ramirez, female    DOB: 1970-02-13  Age: 50 y.o. MRN: [id number]  CC: Hypertension  This visit occurred during the SARS-CoV-2 public health emergency.  Safety protocols were in place, including screening questions prior to the visit, additional usage of staff PPE, and extensive cleaning of exam room while observing appropriate contact time as indicated for disinfecting solutions.    HPI Penny Ramirez presents for f/up -She has felt well recently and offers no complaints. She tells me her blood pressure has been well controlled.  Outpatient Medications Prior to Visit  Medication Sig Dispense Refill  . rosuvastatin (CRESTOR) 5 MG tablet Take 1 tablet (5 mg total) by mouth daily. 90 tablet 1  . carvedilol (COREG) 12.5 MG tablet Take 1 tablet (12.5 mg total) by mouth 2 (two) times daily with a meal. 180 tablet 0  . potassium chloride SA (KLOR-CON) 20 MEQ tablet Take 1 tablet (20 mEq total) by mouth daily. 90 tablet 0  . triamterene-hydrochlorothiazide (MAXZIDE-25) 37.5-25 MG tablet Take 1 tablet by mouth daily. 90 tablet 0   No facility-administered medications prior to visit.    ROS Review of Systems  Constitutional: Negative for appetite change, diaphoresis, fatigue and unexpected weight change.  HENT: Negative.   Eyes: Negative.   Respiratory: Negative for cough, chest tightness, shortness of breath and wheezing.   Cardiovascular: Negative for chest pain, palpitations and leg swelling.  Gastrointestinal: Negative for abdominal pain and diarrhea.  Endocrine: Negative.   Genitourinary: Negative.  Negative for difficulty urinating.  Musculoskeletal: Negative for arthralgias and myalgias.  Skin: Negative.   Neurological: Negative for dizziness, weakness, light-headedness and headaches.  Hematological: Negative for adenopathy. Does not bruise/bleed easily.  Psychiatric/Behavioral: Negative.     Objective:  BP 132/76   Pulse 94   Temp 98.5 F  (36.9 C) (Oral)   Ht 5' 4"  (1.626 m)   Wt 220 lb (99.8 kg)   SpO2 99%   BMI 37.76 kg/m   BP Readings from Last 3 Encounters:  02/25/20 132/76  08/06/19 (!) 160/90  12/25/18 (!) 142/92    Wt Readings from Last 3 Encounters:  02/25/20 220 lb (99.8 kg)  08/06/19 236 lb (107 kg)  12/25/18 235 lb (106.6 kg)    Physical Exam Vitals reviewed.  HENT:     Nose: Nose normal.     Mouth/Throat:     Mouth: Mucous membranes are moist.  Eyes:     General: No scleral icterus.    Conjunctiva/sclera: Conjunctivae normal.  Cardiovascular:     Rate and Rhythm: Normal rate and regular rhythm.     Heart sounds: No murmur heard.   Pulmonary:     Effort: Pulmonary effort is normal.     Breath sounds: No stridor. No wheezing, rhonchi or rales.  Abdominal:     General: Abdomen is flat. Bowel sounds are normal. There is no distension.     Palpations: Abdomen is soft. There is no hepatomegaly, splenomegaly or mass.     Tenderness: There is no abdominal tenderness.  Musculoskeletal:        General: Normal range of motion.     Cervical back: Neck supple.     Right lower leg: No edema.  Lymphadenopathy:     Cervical: No cervical adenopathy.  Skin:    General: Skin is warm and dry.  Neurological:     General: No focal deficit present.     Mental Status: She is alert.  Psychiatric:  Mood and Affect: Mood normal.        Behavior: Behavior normal.     Lab Results  Component Value Date   WBC 7.1 02/25/2020   HGB 12.8 02/25/2020   HCT 38.3 02/25/2020   PLT 318.0 02/25/2020   GLUCOSE 101 (H) 02/25/2020   CHOL 209 (H) 08/06/2019   TRIG 100 08/06/2019   HDL 40 (L) 08/06/2019   LDLCALC 148 (H) 08/06/2019   ALT 17 08/06/2019   AST 16 08/06/2019   NA 137 02/25/2020   K 4.0 02/25/2020   CL 103 02/25/2020   CREATININE 1.05 02/25/2020   BUN 10 02/25/2020   CO2 28 02/25/2020   TSH 1.15 06/19/2018   HGBA1C 5.9 (H) 08/06/2019    MM 3D SCREEN BREAST BILATERAL  Result Date:  01/09/2020 CLINICAL DATA:  Screening. EXAM: DIGITAL SCREENING BILATERAL MAMMOGRAM WITH TOMO AND CAD COMPARISON:  Previous exam(s). ACR Breast Density Category b: There are scattered areas of fibroglandular density. FINDINGS: There are no findings suspicious for malignancy. Images were processed with CAD. IMPRESSION: No mammographic evidence of malignancy. A result letter of this screening mammogram will be mailed directly to the patient. RECOMMENDATION: Screening mammogram in one year. (Code:SM-B-01Y) BI-RADS CATEGORY  1: Negative. Electronically Signed   By: Evangeline Dakin M.D.   On: 01/09/2020 15:37    Assessment & Plan:   Colton was seen today for hypertension.  Diagnoses and all orders for this visit:  Essential hypertension, benign- Her blood pressure is well controlled. Electrolytes and renal function are normal. Will continue the current antihypertensives. -     Basic metabolic panel; Future -     CBC with Differential/Platelet; Future -     CBC with Differential/Platelet -     Basic metabolic panel -     triamterene-hydrochlorothiazide (MAXZIDE-25) 37.5-25 MG tablet; Take 1 tablet by mouth daily. -     carvedilol (COREG) 12.5 MG tablet; Take 1 tablet (12.5 mg total) by mouth 2 (two) times daily with a meal.   I have discontinued Penny Ramirez's potassium chloride SA. I am also having her maintain her rosuvastatin, triamterene-hydrochlorothiazide, and carvedilol.  Meds ordered this encounter  Medications  . triamterene-hydrochlorothiazide (MAXZIDE-25) 37.5-25 MG tablet    Sig: Take 1 tablet by mouth daily.    Dispense:  90 tablet    Refill:  1  . carvedilol (COREG) 12.5 MG tablet    Sig: Take 1 tablet (12.5 mg total) by mouth 2 (two) times daily with a meal.    Dispense:  180 tablet    Refill:  1     Follow-up: Return in about 6 months (around 08/24/2020).  [name], MD

## 2020-03-28 IMAGING — MG DIGITAL SCREENING BILATERAL MAMMOGRAM WITH TOMO AND CAD
8 series · 8 of 24 positions shown · non-contrast
Comparison: Previous exam(s).

CLINICAL DATA: Screening.

EXAM:
DIGITAL SCREENING BILATERAL MAMMOGRAM WITH TOMO AND CAD

[R CC synth-2D]
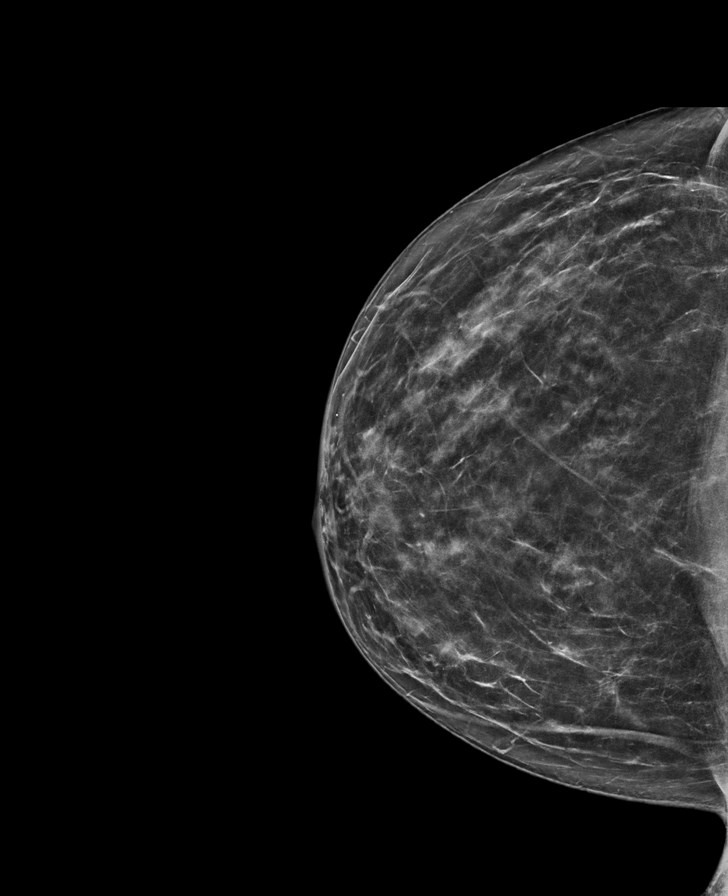

[L MLO synth-2D]
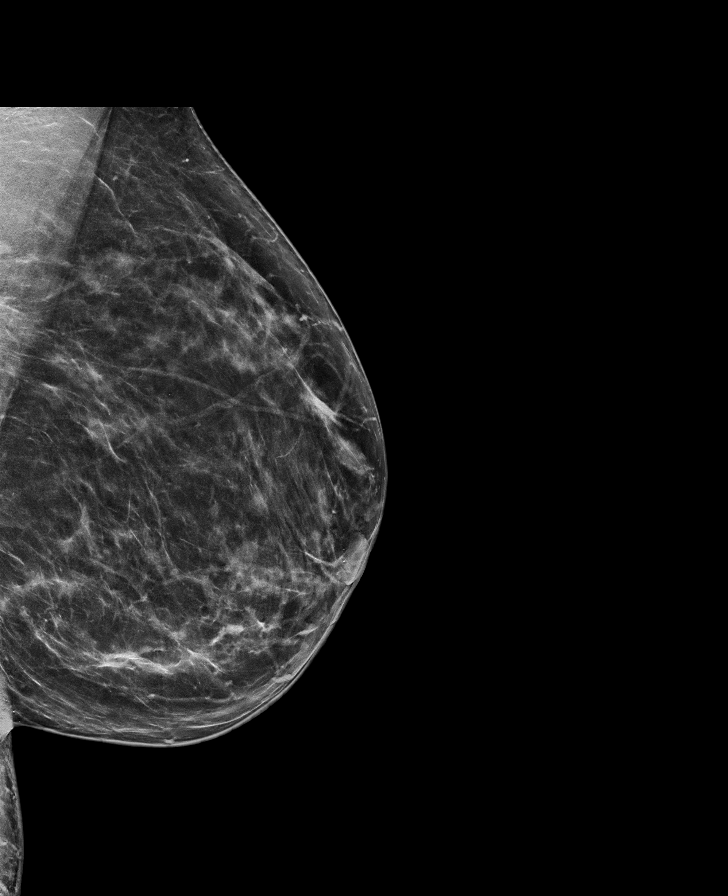

[L CC synth-2D]
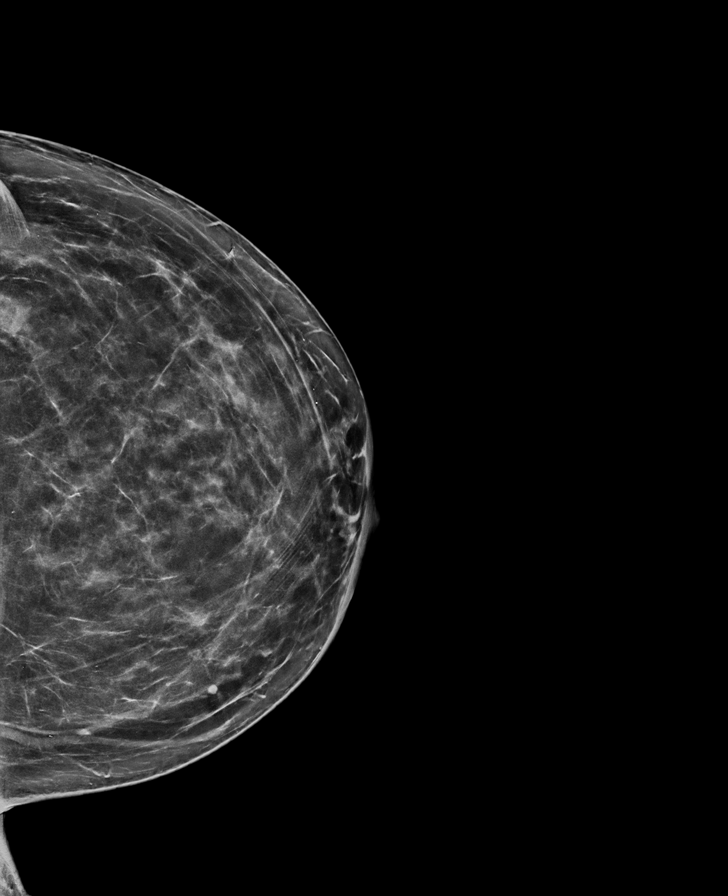

[R MLO synth-2D]
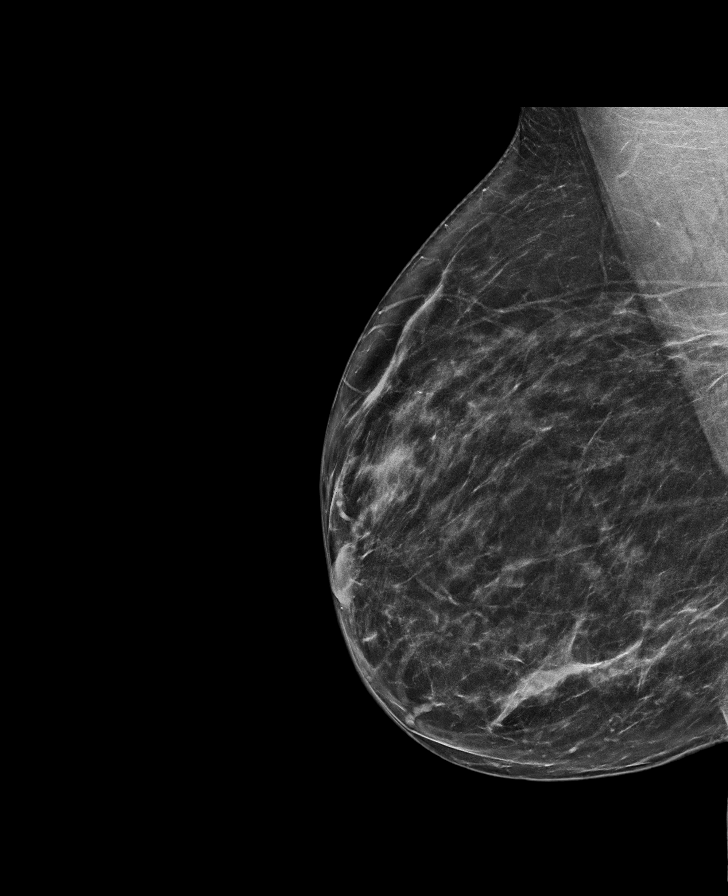

[R MLO tomo · tomo slice 41/80.0]
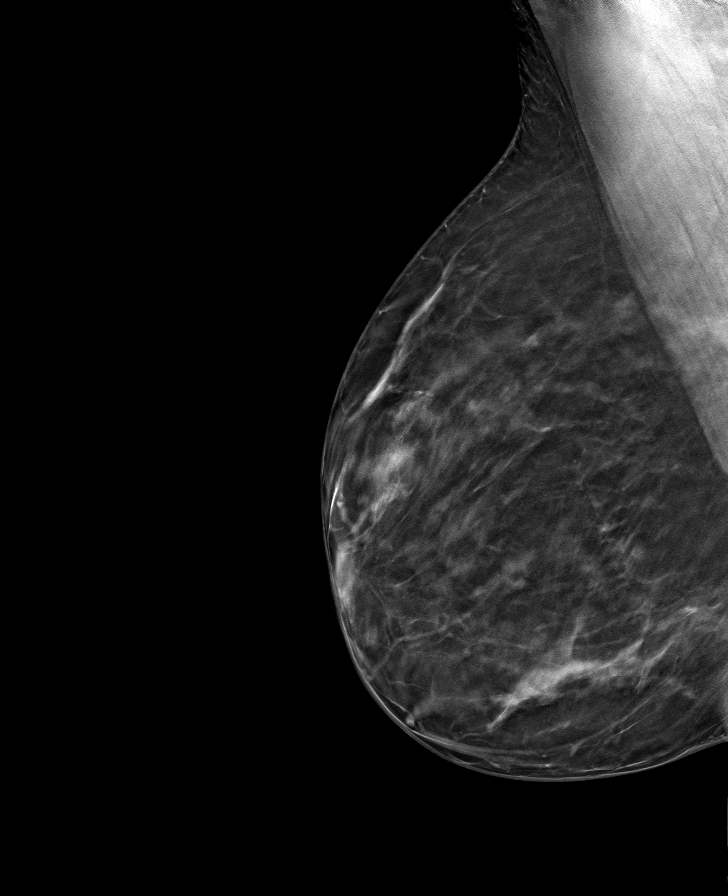

[L MLO tomo · tomo slice 41/80.0]
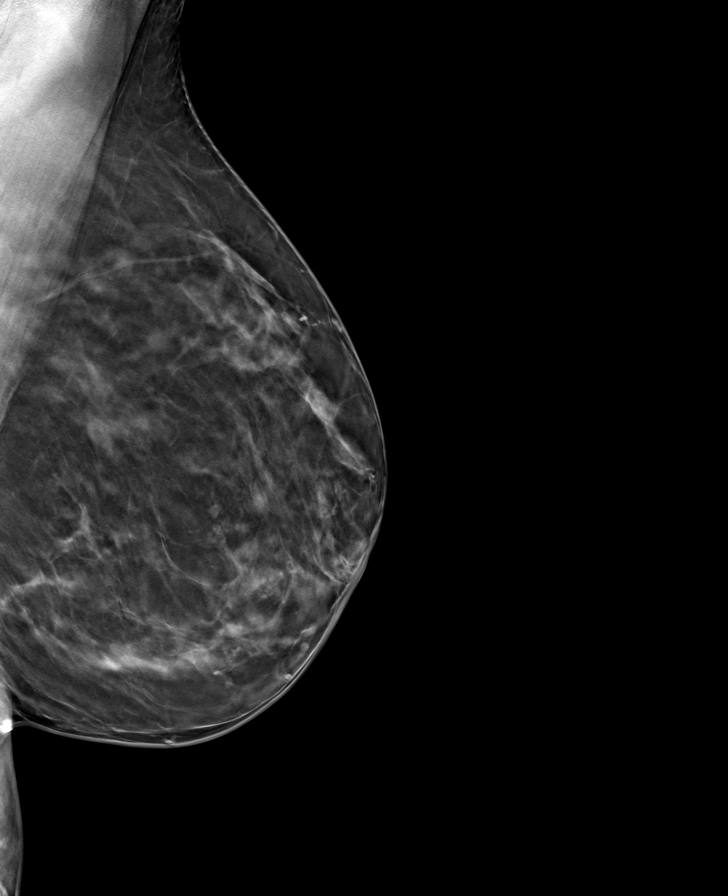

[L CC tomo · tomo slice 38/75.0]
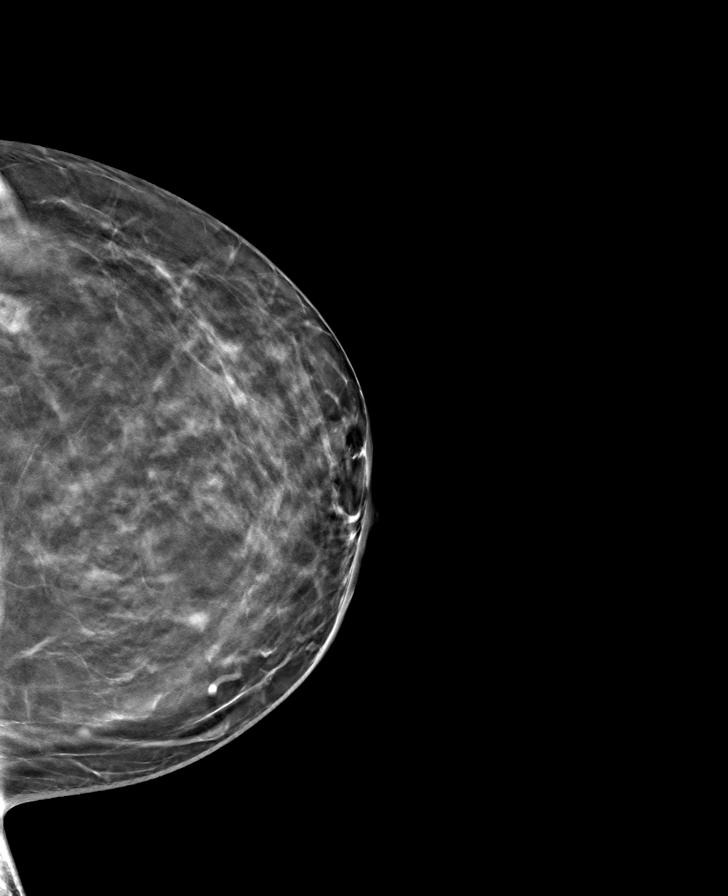

[R CC tomo · tomo slice 37/72.0]
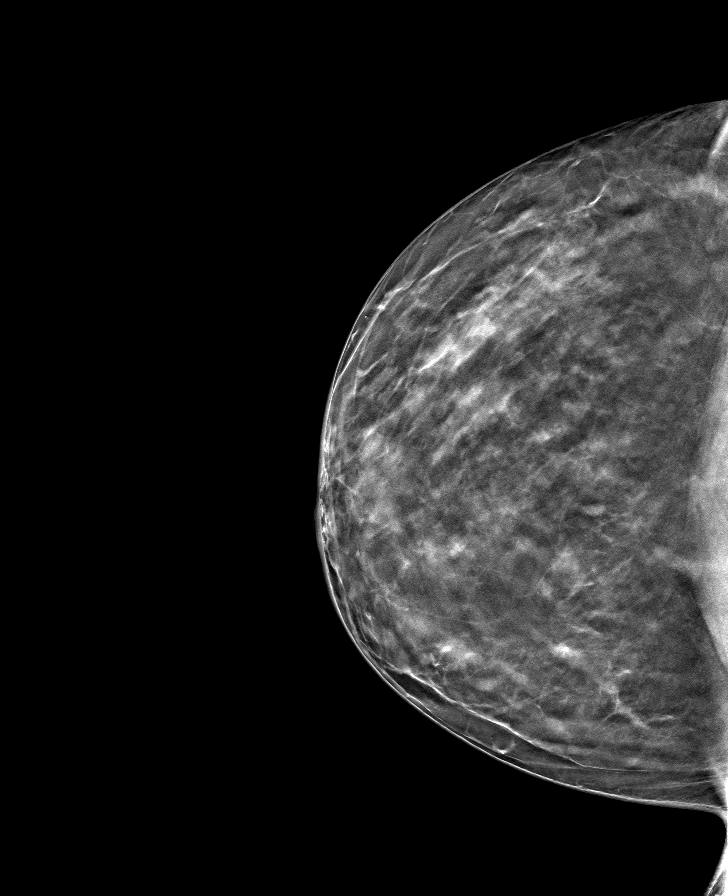

[8 of 24 positions shown; findings below may reference images not displayed]

ACR Breast Density Category c: The breast tissue is heterogeneously
dense, which may obscure small masses.
FINDINGS: There are no findings suspicious for malignancy. Images were
processed with CAD.
IMPRESSION: No mammographic evidence of malignancy. A result letter of this
screening mammogram will be mailed directly to the patient.

RECOMMENDATION:
Screening mammogram in one year. (Code:FT-U-LHB)

BI-RADS CATEGORY  1: Negative.

## 2020-06-02 ENCOUNTER — Other Ambulatory Visit (HOSPITAL_COMMUNITY): Payer: Self-pay

## 2020-06-02 MED FILL — Triamterene & Hydrochlorothiazide Tab 37.5-25 MG: ORAL | 90 days supply | Qty: 90 | Fill #0 | Status: AC

## 2020-09-20 ENCOUNTER — Other Ambulatory Visit: Payer: Self-pay

## 2020-09-20 ENCOUNTER — Ambulatory Visit (INDEPENDENT_AMBULATORY_CARE_PROVIDER_SITE_OTHER): Payer: No Typology Code available for payment source | Admitting: Internal Medicine

## 2020-09-20 ENCOUNTER — Encounter: Payer: Self-pay | Admitting: Internal Medicine

## 2020-09-20 VITALS — BP 132/70 | HR 83 | Temp 98.4°F | Resp 16 | Ht 64.0 in | Wt 232.0 lb

## 2020-09-20 DIAGNOSIS — Z0000 Encounter for general adult medical examination without abnormal findings: Secondary | ICD-10-CM

## 2020-09-20 DIAGNOSIS — R739 Hyperglycemia, unspecified: Secondary | ICD-10-CM | POA: Diagnosis not present

## 2020-09-20 DIAGNOSIS — E559 Vitamin D deficiency, unspecified: Secondary | ICD-10-CM

## 2020-09-20 DIAGNOSIS — I10 Essential (primary) hypertension: Secondary | ICD-10-CM | POA: Diagnosis not present

## 2020-09-20 DIAGNOSIS — E785 Hyperlipidemia, unspecified: Secondary | ICD-10-CM

## 2020-09-20 DIAGNOSIS — Z0001 Encounter for general adult medical examination with abnormal findings: Secondary | ICD-10-CM | POA: Insufficient documentation

## 2020-09-20 LAB — CBC WITH DIFFERENTIAL/PLATELET
Basophils Absolute: 0 10*3/uL (ref 0.0–0.1)
Basophils Relative: 0.6 % (ref 0.0–3.0)
Eosinophils Absolute: 0.3 10*3/uL (ref 0.0–0.7)
Eosinophils Relative: 4.8 % (ref 0.0–5.0)
HCT: 37.1 % (ref 36.0–46.0)
Hemoglobin: 12.4 g/dL (ref 12.0–15.0)
Lymphocytes Relative: 36.9 % (ref 12.0–46.0)
Lymphs Abs: 2.4 10*3/uL (ref 0.7–4.0)
MCHC: 33.5 g/dL (ref 30.0–36.0)
MCV: 89.4 fl (ref 78.0–100.0)
Monocytes Absolute: 0.5 10*3/uL (ref 0.1–1.0)
Monocytes Relative: 8.4 % (ref 3.0–12.0)
Neutro Abs: 3.2 10*3/uL (ref 1.4–7.7)
Neutrophils Relative %: 49.3 % (ref 43.0–77.0)
Platelets: 272 10*3/uL (ref 150.0–400.0)
RBC: 4.15 Mil/uL (ref 3.87–5.11)
RDW: 13.8 % (ref 11.5–15.5)
WBC: 6.4 10*3/uL (ref 4.0–10.5)

## 2020-09-20 LAB — URINALYSIS, ROUTINE W REFLEX MICROSCOPIC
Bilirubin Urine: NEGATIVE
Ketones, ur: NEGATIVE
Nitrite: NEGATIVE
Specific Gravity, Urine: 1.02 (ref 1.000–1.030)
Total Protein, Urine: NEGATIVE
Urine Glucose: NEGATIVE
Urobilinogen, UA: 0.2 (ref 0.0–1.0)
pH: 6.5 (ref 5.0–8.0)

## 2020-09-20 LAB — BASIC METABOLIC PANEL
BUN: 15 mg/dL (ref 6–23)
CO2: 27 mEq/L (ref 19–32)
Calcium: 9.3 mg/dL (ref 8.4–10.5)
Chloride: 103 mEq/L (ref 96–112)
Creatinine, Ser: 0.98 mg/dL (ref 0.40–1.20)
GFR: 68.37 mL/min (ref >60.00)
Glucose, Bld: 106 mg/dL — ABNORMAL HIGH (ref 70–99)
Potassium: 3.6 mEq/L (ref 3.5–5.1)
Sodium: 139 mEq/L (ref 135–145)

## 2020-09-20 LAB — LIPID PANEL
Cholesterol: 184 mg/dL (ref 0–200)
HDL: 43.3 mg/dL (ref >39.00)
LDL Cholesterol: 126 mg/dL — ABNORMAL HIGH (ref 0–99)
NonHDL: 140.74
Total CHOL/HDL Ratio: 4
Triglycerides: 72 mg/dL (ref 0.0–149.0)
VLDL: 14.4 mg/dL (ref 0.0–40.0)

## 2020-09-20 LAB — HEPATIC FUNCTION PANEL
ALT: 15 U/L (ref 0–35)
AST: 18 U/L (ref 0–37)
Albumin: 3.7 g/dL (ref 3.5–5.2)
Alkaline Phosphatase: 64 U/L (ref 39–117)
Bilirubin, Direct: 0 mg/dL (ref 0.0–0.3)
Total Bilirubin: 0.3 mg/dL (ref 0.2–1.2)
Total Protein: 6.9 g/dL (ref 6.0–8.3)

## 2020-09-20 LAB — HEMOGLOBIN A1C: Hgb A1c MFr Bld: 6.1 % (ref 4.6–6.5)

## 2020-09-20 NOTE — Progress Notes (Signed)
Subjective:  Patient ID: Penny Ramirez, female    DOB: 08/23/1970  Age: 50 y.o. MRN: [id number]  CC: Annual Exam, Hypertension, and Hyperlipidemia  This visit occurred during the SARS-CoV-2 public health emergency.  Safety protocols were in place, including screening questions prior to the visit, additional usage of staff PPE, and extensive cleaning of exam room while observing appropriate contact time as indicated for disinfecting solutions.    HPI Penny Ramirez presents for a CPX and f/up -  She tells me her blood pressure has been well controlled.  She has felt well recently and offers no complaints.  Outpatient Medications Prior to Visit  Medication Sig Dispense Refill   carvedilol (COREG) 12.5 MG tablet TAKE 1 TABLET (12.5 MG TOTAL) BY MOUTH 2 (TWO) TIMES DAILY WITH A MEAL. 180 tablet 1   rosuvastatin (CRESTOR) 5 MG tablet Take 1 tablet (5 mg total) by mouth daily. 90 tablet 1   triamterene-hydrochlorothiazide (MAXZIDE-25) 37.5-25 MG tablet TAKE 1 TABLET BY MOUTH ONCE DAILY. 90 tablet 1   No facility-administered medications prior to visit.    ROS Review of Systems  Constitutional:  Negative for diaphoresis and fatigue.  HENT: Negative.    Eyes: Negative.   Respiratory:  Negative for cough, chest tightness, shortness of breath and wheezing.   Cardiovascular:  Negative for chest pain, palpitations and leg swelling.  Gastrointestinal:  Negative for abdominal pain, constipation, diarrhea, nausea and vomiting.  Endocrine: Negative.   Genitourinary: Negative.  Negative for difficulty urinating, dysuria and hematuria.  Musculoskeletal: Negative.  Negative for arthralgias and myalgias.  Skin: Negative.   Neurological:  Negative for dizziness, weakness, light-headedness and headaches.  Hematological:  Negative for adenopathy. Does not bruise/bleed easily.  Psychiatric/Behavioral: Negative.     Objective:  BP 132/70 (BP Location: Right Arm, Patient Position: Sitting, Cuff  Size: Large)   Pulse 83   Temp 98.4 F (36.9 C) (Oral)   Resp 16   Ht 5' 4"  (1.626 m)   Wt 232 lb (105.2 kg)   LMP 08/30/2020 (Exact Date)   SpO2 98%   BMI 39.82 kg/m   BP Readings from Last 3 Encounters:  09/20/20 132/70  02/25/20 132/76  08/06/19 (!) 160/90    Wt Readings from Last 3 Encounters:  09/20/20 232 lb (105.2 kg)  02/25/20 220 lb (99.8 kg)  08/06/19 236 lb (107 kg)    Physical Exam Vitals reviewed.  HENT:     Nose: Nose normal.     Mouth/Throat:     Mouth: Mucous membranes are moist.  Eyes:     Conjunctiva/sclera: Conjunctivae normal.  Cardiovascular:     Rate and Rhythm: Normal rate and regular rhythm.     Heart sounds: No murmur heard. Pulmonary:     Effort: Pulmonary effort is normal.     Breath sounds: No stridor. No wheezing, rhonchi or rales.  Abdominal:     General: Abdomen is flat.     Palpations: There is no mass.     Tenderness: There is no abdominal tenderness. There is no guarding.     Hernia: No hernia is present.  Musculoskeletal:        General: Normal range of motion.     Cervical back: Neck supple.     Right lower leg: No edema.     Left lower leg: No edema.  Lymphadenopathy:     Cervical: No cervical adenopathy.  Skin:    General: Skin is warm and dry.  Neurological:  General: No focal deficit present.     Mental Status: She is alert.  Psychiatric:        Mood and Affect: Mood normal.        Behavior: Behavior normal.    Lab Results  Component Value Date   WBC 6.4 09/20/2020   HGB 12.4 09/20/2020   HCT 37.1 09/20/2020   PLT 272.0 09/20/2020   GLUCOSE 106 (H) 09/20/2020   CHOL 184 09/20/2020   TRIG 72.0 09/20/2020   HDL 43.30 09/20/2020   LDLCALC 126 (H) 09/20/2020   ALT 15 09/20/2020   AST 18 09/20/2020   NA 139 09/20/2020   K 3.6 09/20/2020   CL 103 09/20/2020   CREATININE 0.98 09/20/2020   BUN 15 09/20/2020   CO2 27 09/20/2020   TSH 1.81 09/20/2020   HGBA1C 6.1 09/20/2020    MM 3D SCREEN BREAST  BILATERAL  Result Date: 01/09/2020 CLINICAL DATA:  Screening. EXAM: DIGITAL SCREENING BILATERAL MAMMOGRAM WITH TOMO AND CAD COMPARISON:  Previous exam(s). ACR Breast Density Category b: There are scattered areas of fibroglandular density. FINDINGS: There are no findings suspicious for malignancy. Images were processed with CAD. IMPRESSION: No mammographic evidence of malignancy. A result letter of this screening mammogram will be mailed directly to the patient. RECOMMENDATION: Screening mammogram in one year. (Code:SM-B-01Y) BI-RADS CATEGORY  1: Negative. Electronically Signed   By: Evangeline Dakin M.D.   On: 01/09/2020 15:37    Assessment & Plan:   Penny Ramirez was seen today for annual exam, hypertension and hyperlipidemia.  Diagnoses and all orders for this visit:  Essential hypertension, benign- Her BP is well controlled. Lytes and renal function are normal. Will continue the current agents. -     CBC with Differential/Platelet; Future -     Basic metabolic panel; Future -     TSH; Future -     Urinalysis, Routine w reflex microscopic; Future -     Hepatic function panel; Future -     VITAMIN D 25 Hydroxy (Vit-D Deficiency, Fractures); Future -     VITAMIN D 25 Hydroxy (Vit-D Deficiency, Fractures) -     Hepatic function panel -     TSH -     Basic metabolic panel -     CBC with Differential/Platelet -     Urinalysis, Routine w reflex microscopic -     triamterene-hydrochlorothiazide (MAXZIDE-25) 37.5-25 MG tablet; Take 1 tablet by mouth daily.  Hyperglycemia- She is prediabetic. She will continue working on her LS modifications. -     Basic metabolic panel; Future -     Hemoglobin A1c; Future -     Hemoglobin A1c -     Basic metabolic panel  Vitamin D insufficiency -     VITAMIN D 25 Hydroxy (Vit-D Deficiency, Fractures); Future -     VITAMIN D 25 Hydroxy (Vit-D Deficiency, Fractures)  Encounter for general adult medical examination with abnormal findings- Exam completed, labs  reviewed, vaccines and cancer screenings UTD, pt ed material was given.  Hyperlipidemia LDL goal <130- Statin tx is not indicated. -     TSH; Future -     Hepatic function panel; Future -     Hepatic function panel -     TSH  I have discontinued Anum K. Davison's rosuvastatin. I have also changed her triamterene-hydrochlorothiazide. Additionally, I am having her maintain her carvedilol.  Meds ordered this encounter  Medications   triamterene-hydrochlorothiazide (MAXZIDE-25) 37.5-25 MG tablet    Sig: Take 1 tablet by  mouth daily.    Dispense:  90 tablet    Refill:  1      Follow-up: Return in about 6 months (around 03/23/2021).  [name], MD

## 2020-09-20 NOTE — Patient Instructions (Signed)
Health Maintenance, Female Adopting a healthy lifestyle and getting preventive care are important in promoting health and wellness. Ask your health care provider about: The right schedule for you to have regular tests and exams. Things you can do on your own to prevent diseases and keep yourself healthy. What should I know about diet, weight, and exercise? Eat a healthy diet  Eat a diet that includes plenty of vegetables, fruits, low-fat dairy products, and lean protein. Do not eat a lot of foods that are high in solid fats, added sugars, or sodium.  Maintain a healthy weight Body mass index (BMI) is used to identify weight problems. It estimates body fat based on height and weight. Your health care provider can help determineyour BMI and help you achieve or maintain a healthy weight. Get regular exercise Get regular exercise. This is one of the most important things you can do for your health. Most adults should: Exercise for at least 150 minutes each week. The exercise should increase your heart rate and make you sweat (moderate-intensity exercise). Do strengthening exercises at least twice a week. This is in addition to the moderate-intensity exercise. Spend less time sitting. Even light physical activity can be beneficial. Watch cholesterol and blood lipids Have your blood tested for lipids and cholesterol at 48 years of age, then havethis test every 5 years. Have your cholesterol levels checked more often if: Your lipid or cholesterol levels are high. You are older than 48 years of age. You are at high risk for heart disease. What should I know about cancer screening? Depending on your health history and family history, you may need to have cancer screening at various ages. This may include screening for: Breast cancer. Cervical cancer. Colorectal cancer. Skin cancer. Lung cancer. What should I know about heart disease, diabetes, and high blood pressure? Blood pressure and heart  disease High blood pressure causes heart disease and increases the risk of stroke. This is more likely to develop in people who have high blood pressure readings, are of African descent, or are overweight. Have your blood pressure checked: Every 3-5 years if you are 18-39 years of age. Every year if you are 40 years old or older. Diabetes Have regular diabetes screenings. This checks your fasting blood sugar level. Have the screening done: Once every three years after age 40 if you are at a normal weight and have a low risk for diabetes. More often and at a younger age if you are overweight or have a high risk for diabetes. What should I know about preventing infection? Hepatitis B If you have a higher risk for hepatitis B, you should be screened for this virus. Talk with your health care provider to find out if you are at risk forhepatitis B infection. Hepatitis C Testing is recommended for: Everyone born from 1945 through 1965. Anyone with known risk factors for hepatitis C. Sexually transmitted infections (STIs) Get screened for STIs, including gonorrhea and chlamydia, if: You are sexually active and are younger than 48 years of age. You are older than 48 years of age and your health care provider tells you that you are at risk for this type of infection. Your sexual activity has changed since you were last screened, and you are at increased risk for chlamydia or gonorrhea. Ask your health care provider if you are at risk. Ask your health care provider about whether you are at high risk for HIV. Your health care provider may recommend a prescription medicine to help   prevent HIV infection. If you choose to take medicine to prevent HIV, you should first get tested for HIV. You should then be tested every 3 months for as long as you are taking the medicine. Pregnancy If you are about to stop having your period (premenopausal) and you may become pregnant, seek counseling before you get  pregnant. Take 400 to 800 micrograms (mcg) of folic acid every day if you become pregnant. Ask for birth control (contraception) if you want to prevent pregnancy. Osteoporosis and menopause Osteoporosis is a disease in which the bones lose minerals and strength with aging. This can result in bone fractures. If you are 47 years old or older, or if you are at risk for osteoporosis and fractures, ask your health care provider if you should: Be screened for bone loss. Take a calcium or vitamin D supplement to lower your risk of fractures. Be given hormone replacement therapy (HRT) to treat symptoms of menopause. Follow these instructions at home: Lifestyle Do not use any products that contain nicotine or tobacco, such as cigarettes, e-cigarettes, and chewing tobacco. If you need help quitting, ask your health care provider. Do not use street drugs. Do not share needles. Ask your health care provider for help if you need support or information about quitting drugs. Alcohol use Do not drink alcohol if: Your health care provider tells you not to drink. You are pregnant, may be pregnant, or are planning to become pregnant. If you drink alcohol: Limit how much you use to 0-1 drink a day. Limit intake if you are breastfeeding. Be aware of how much alcohol is in your drink. In the [place] one drink equals one 12 oz bottle of beer (355 mL), one 5 oz glass of wine (148 mL), or one 1 oz glass of hard liquor (44 mL). General instructions Schedule regular health, dental, and eye exams. Stay current with your vaccines. Tell your health care provider if: You often feel depressed. You have ever been abused or do not feel safe at home. Summary Adopting a healthy lifestyle and getting preventive care are important in promoting health and wellness. Follow your health care provider's instructions about healthy diet, exercising, and getting tested or screened for diseases. Follow your health care provider's  instructions on monitoring your cholesterol and blood pressure. This information is not intended to replace advice given to you by your health care provider. Make sure you discuss any questions you have with your healthcare provider. Document Revised: 01/09/2018 Document Reviewed: 01/09/2018 Elsevier Patient Education  2022 [employer].

## 2020-09-21 ENCOUNTER — Other Ambulatory Visit (HOSPITAL_COMMUNITY): Payer: Self-pay

## 2020-09-21 ENCOUNTER — Other Ambulatory Visit: Payer: Self-pay | Admitting: Internal Medicine

## 2020-09-21 DIAGNOSIS — I10 Essential (primary) hypertension: Secondary | ICD-10-CM

## 2020-09-21 LAB — TSH: TSH: 1.81 u[IU]/mL (ref 0.35–5.50)

## 2020-09-21 LAB — VITAMIN D 25 HYDROXY (VIT D DEFICIENCY, FRACTURES): VITD: 41.69 ng/mL (ref 30.00–100.00)

## 2020-09-21 MED ORDER — TRIAMTERENE-HCTZ 37.5-25 MG PO TABS
1.0000 | ORAL_TABLET | Freq: Every day | ORAL | 1 refills | Status: DC
Start: 2020-09-21 — End: 2021-04-04
  Filled 2020-09-21: qty 90, 90d supply, fill #0
  Filled 2020-12-20: qty 90, 90d supply, fill #1

## 2020-10-28 ENCOUNTER — Other Ambulatory Visit: Payer: Self-pay | Admitting: Obstetrics and Gynecology

## 2020-10-28 DIAGNOSIS — Z1231 Encounter for screening mammogram for malignant neoplasm of breast: Secondary | ICD-10-CM

## 2020-12-20 ENCOUNTER — Other Ambulatory Visit (HOSPITAL_COMMUNITY): Payer: Self-pay

## 2021-01-06 ENCOUNTER — Ambulatory Visit: Payer: No Typology Code available for payment source

## 2021-01-17 LAB — HM PAP SMEAR

## 2021-01-18 ENCOUNTER — Ambulatory Visit
Admission: RE | Admit: 2021-01-18 | Discharge: 2021-01-18 | Disposition: A | Discharge status: Home / Self Care | Payer: No Typology Code available for payment source | Source: Ambulatory Visit | Attending: Obstetrics and Gynecology | Admitting: Obstetrics and Gynecology

## 2021-01-18 DIAGNOSIS — Z1231 Encounter for screening mammogram for malignant neoplasm of breast: Secondary | ICD-10-CM

## 2021-02-02 ENCOUNTER — Ambulatory Visit: Payer: No Typology Code available for payment source

## 2021-03-22 ENCOUNTER — Other Ambulatory Visit: Payer: Self-pay | Admitting: Internal Medicine

## 2021-03-22 ENCOUNTER — Other Ambulatory Visit (HOSPITAL_COMMUNITY): Payer: Self-pay

## 2021-03-22 DIAGNOSIS — I10 Essential (primary) hypertension: Secondary | ICD-10-CM

## 2021-03-23 ENCOUNTER — Other Ambulatory Visit (HOSPITAL_COMMUNITY): Payer: Self-pay

## 2021-03-28 ENCOUNTER — Other Ambulatory Visit (HOSPITAL_COMMUNITY): Payer: Self-pay

## 2021-03-28 ENCOUNTER — Other Ambulatory Visit: Payer: Self-pay | Admitting: Internal Medicine

## 2021-03-28 DIAGNOSIS — I10 Essential (primary) hypertension: Secondary | ICD-10-CM

## 2021-03-29 ENCOUNTER — Other Ambulatory Visit (HOSPITAL_COMMUNITY): Payer: Self-pay

## 2021-03-29 ENCOUNTER — Encounter: Payer: Self-pay | Admitting: Internal Medicine

## 2021-03-29 DIAGNOSIS — I10 Essential (primary) hypertension: Secondary | ICD-10-CM

## 2021-04-04 ENCOUNTER — Other Ambulatory Visit (HOSPITAL_COMMUNITY): Payer: Self-pay

## 2021-04-04 ENCOUNTER — Ambulatory Visit (INDEPENDENT_AMBULATORY_CARE_PROVIDER_SITE_OTHER): Payer: No Typology Code available for payment source | Admitting: Internal Medicine

## 2021-04-04 ENCOUNTER — Other Ambulatory Visit: Payer: Self-pay

## 2021-04-04 ENCOUNTER — Encounter: Payer: Self-pay | Admitting: Internal Medicine

## 2021-04-04 VITALS — BP 158/96 | HR 71 | Temp 98.0°F | Resp 16 | Ht 64.0 in | Wt 237.0 lb

## 2021-04-04 DIAGNOSIS — R7303 Prediabetes: Secondary | ICD-10-CM

## 2021-04-04 DIAGNOSIS — N910 Primary amenorrhea: Secondary | ICD-10-CM

## 2021-04-04 DIAGNOSIS — I10 Essential (primary) hypertension: Secondary | ICD-10-CM

## 2021-04-04 LAB — BASIC METABOLIC PANEL
BUN: 11 mg/dL (ref 6–23)
CO2: 31 mEq/L (ref 19–32)
Calcium: 9.2 mg/dL (ref 8.4–10.5)
Chloride: 100 mEq/L (ref 96–112)
Creatinine, Ser: 0.94 mg/dL (ref 0.40–1.20)
GFR: 71.61 mL/min (ref >60.00)
Glucose, Bld: 93 mg/dL (ref 70–99)
Potassium: 3.6 mEq/L (ref 3.5–5.1)
Sodium: 138 mEq/L (ref 135–145)

## 2021-04-04 LAB — HCG, QUANTITATIVE, PREGNANCY: Quantitative HCG: <0.6 m[IU]/mL

## 2021-04-04 LAB — CBC WITH DIFFERENTIAL/PLATELET
Basophils Absolute: 0.1 10*3/uL (ref 0.0–0.1)
Basophils Relative: 0.7 % (ref 0.0–3.0)
Eosinophils Absolute: 0.5 10*3/uL (ref 0.0–0.7)
Eosinophils Relative: 6.1 % — ABNORMAL HIGH (ref 0.0–5.0)
HCT: 39.3 % (ref 36.0–46.0)
Hemoglobin: 13.2 g/dL (ref 12.0–15.0)
Lymphocytes Relative: 29.2 % (ref 12.0–46.0)
Lymphs Abs: 2.6 10*3/uL (ref 0.7–4.0)
MCHC: 33.5 g/dL (ref 30.0–36.0)
MCV: 89 fl (ref 78.0–100.0)
Monocytes Absolute: 0.7 10*3/uL (ref 0.1–1.0)
Monocytes Relative: 8 % (ref 3.0–12.0)
Neutro Abs: 5 10*3/uL (ref 1.4–7.7)
Neutrophils Relative %: 56 % (ref 43.0–77.0)
Platelets: 331 10*3/uL (ref 150.0–400.0)
RBC: 4.41 Mil/uL (ref 3.87–5.11)
RDW: 13.7 % (ref 11.5–15.5)
WBC: 9 10*3/uL (ref 4.0–10.5)

## 2021-04-04 LAB — HEMOGLOBIN A1C: Hgb A1c MFr Bld: 6.2 % (ref 4.6–6.5)

## 2021-04-04 MED ORDER — OMRON 3 SERIES BP MONITOR DEVI
0 refills | Status: AC
  Filled 2021-04-04: qty 1, 1d supply, fill #0

## 2021-04-04 MED ORDER — TRIAMTERENE-HCTZ 37.5-25 MG PO TABS
1.0000 | ORAL_TABLET | Freq: Every day | ORAL | 0 refills | Status: DC
  Filled 2021-04-04: qty 90, 90d supply, fill #0

## 2021-04-04 NOTE — Patient Instructions (Signed)

## 2021-04-04 NOTE — Progress Notes (Signed)
? ?Subjective:  ?Patient ID: Penny Ramirez, female    DOB: [birth year]  Age: 51 y.o. MRN: [id number] ? ?CC: Hypertension ? ?This visit occurred during the SARS-CoV-2 public health emergency.  Safety protocols were in place, including screening questions prior to the visit, additional usage of staff PPE, and extensive cleaning of exam room while observing appropriate contact time as indicated for disinfecting solutions.   ? ?HPI ?Penny Ramirez presents for f/up -  ? ?According to RX refills she would have run out of dyazide recently.  She is active and denies headache, blurred vision, chest pain, shortness of breath, diaphoresis, dizziness, or lightheadedness.  She is concerned that her last menstrual cycle was 6 weeks ago. ? ?Outpatient Medications Prior to Visit  ?Medication Sig Dispense Refill  ? carvedilol (COREG) 12.5 MG tablet TAKE 1 TABLET (12.5 MG TOTAL) BY MOUTH 2 (TWO) TIMES DAILY WITH A MEAL. 180 tablet 1  ? triamterene-hydrochlorothiazide (MAXZIDE-25) 37.5-25 MG tablet Take 1 tablet by mouth daily. 90 tablet 1  ? ?No facility-administered medications prior to visit.  ? ? ?ROS ?Review of Systems  ?Constitutional:  Positive for unexpected weight change (wt gain). Negative for chills, diaphoresis and fatigue.  ?HENT: Negative.    ?Eyes: Negative.   ?Respiratory: Negative.  Negative for cough, shortness of breath and wheezing.   ?Cardiovascular:  Negative for chest pain, palpitations and leg swelling.  ?Gastrointestinal:  Negative for abdominal pain, diarrhea, nausea and vomiting.  ?Genitourinary:  Negative for difficulty urinating and hematuria.  ?Musculoskeletal: Negative.   ?Skin: Negative.   ?Neurological:  Negative for dizziness, weakness and light-headedness.  ?Hematological:  Negative for adenopathy. Does not bruise/bleed easily.  ?Psychiatric/Behavioral: Negative.    ? ?Objective:  ?BP (!) 158/96 (BP Location: Right Arm, Patient Position: Sitting, Cuff Size: Large)   Pulse 71   Temp 98 ?F (36.7  ?C) (Oral)   Resp 16   Ht 5' 4"  (1.626 m)   Wt 237 lb (107.5 kg)   LMP 02/20/2021 (Exact Date)   SpO2 100%   BMI 40.68 kg/m?  ? ?BP Readings from Last 3 Encounters:  ?04/04/21 (!) 158/96  ?09/20/20 132/70  ?02/25/20 132/76  ? ? ?Wt Readings from Last 3 Encounters:  ?04/04/21 237 lb (107.5 kg)  ?09/20/20 232 lb (105.2 kg)  ?02/25/20 220 lb (99.8 kg)  ? ? ?Physical Exam ?Vitals reviewed.  ?HENT:  ?   Nose: Nose normal.  ?   Mouth/Throat:  ?   Mouth: Mucous membranes are moist.  ?Eyes:  ?   General: No scleral icterus. ?   Conjunctiva/sclera: Conjunctivae normal.  ?Cardiovascular:  ?   Rate and Rhythm: Normal rate and regular rhythm.  ?   Heart sounds: No murmur heard. ?Pulmonary:  ?   Effort: Pulmonary effort is normal.  ?   Breath sounds: No stridor. No wheezing, rhonchi or rales.  ?Abdominal:  ?   General: Abdomen is flat.  ?   Palpations: There is no mass.  ?   Tenderness: There is no abdominal tenderness. There is no guarding.  ?   Hernia: No hernia is present.  ?Musculoskeletal:  ?   Cervical back: Neck supple.  ?Lymphadenopathy:  ?   Cervical: No cervical adenopathy.  ?Skin: ?   General: Skin is warm and dry.  ?   Coloration: Skin is not pale.  ?Neurological:  ?   General: No focal deficit present.  ?   Mental Status: She is alert. Mental status is at baseline.  ?Psychiatric:     ?  Mood and Affect: Mood normal.     ?   Behavior: Behavior normal.  ? ? ?Lab Results  ?Component Value Date  ? WBC 9.0 04/04/2021  ? HGB 13.2 04/04/2021  ? HCT 39.3 04/04/2021  ? PLT 331.0 04/04/2021  ? GLUCOSE 93 04/04/2021  ? CHOL 184 09/20/2020  ? TRIG 72.0 09/20/2020  ? HDL 43.30 09/20/2020  ? LDLCALC 126 (H) 09/20/2020  ? ALT 15 09/20/2020  ? AST 18 09/20/2020  ? NA 138 04/04/2021  ? K 3.6 04/04/2021  ? CL 100 04/04/2021  ? CREATININE 0.94 04/04/2021  ? BUN 11 04/04/2021  ? CO2 31 04/04/2021  ? TSH 1.81 09/20/2020  ? HGBA1C 6.2 04/04/2021  ? ? ?MM 3D SCREEN BREAST BILATERAL ? ?Result Date: 01/19/2021 ?CLINICAL DATA:   Screening. EXAM: DIGITAL SCREENING BILATERAL MAMMOGRAM WITH TOMOSYNTHESIS AND CAD TECHNIQUE: Bilateral screening digital craniocaudal and mediolateral oblique mammograms were obtained. Bilateral screening digital breast tomosynthesis was performed. The images were evaluated with computer-aided detection. COMPARISON:  Previous exam(s). ACR Breast Density Category b: There are scattered areas of fibroglandular density. FINDINGS: There are no findings suspicious for malignancy. IMPRESSION: No mammographic evidence of malignancy. A result letter of this screening mammogram will be mailed directly to the patient. RECOMMENDATION: Screening mammogram in one year. (Code:SM-B-01Y) BI-RADS CATEGORY  1: Negative. Electronically Signed   By: Fidela Salisbury M.D.   On: 01/19/2021 10:54  ? ? ?Assessment & Plan:  ? ?Penny Ramirez was seen today for hypertension. ? ?Diagnoses and all orders for this visit: ? ?Essential hypertension, - She has not achieved her blood pressure goal of 130/90.  Will restart Dyazide.  She was encouraged to improve her lifestyle modifications. ?-     Basic metabolic panel; Future ?-     CBC with Differential/Platelet; Future ?-     triamterene-hydrochlorothiazide (MAXZIDE-25) 37.5-25 MG tablet; Take 1 tablet by mouth daily. ?-     CBC with Differential/Platelet ?-     Basic metabolic panel ? ?Prediabetes- Her A1c is up to 6.2%.  She was encouraged to improve her lifestyle modifications. ?-     Hemoglobin A1c; Future ?-     Hemoglobin A1c ? ?Amenorrhea, primary- Her beta-hCG is negative. ?-     hCG, quantitative, pregnancy; Future ?-     hCG, quantitative, pregnancy ? ? ?I am having Penny Ramirez maintain her carvedilol and triamterene-hydrochlorothiazide. ? ?Meds ordered this encounter  ?Medications  ? triamterene-hydrochlorothiazide (MAXZIDE-25) 37.5-25 MG tablet  ?  Sig: Take 1 tablet by mouth daily.  ?  Dispense:  90 tablet  ?  Refill:  0  ? ? ? ?Follow-up: Return in about 3 months (around  07/05/2021). ? ?Penny Calico, MD ?

## 2021-04-05 ENCOUNTER — Other Ambulatory Visit: Payer: Self-pay | Admitting: Internal Medicine

## 2021-04-05 ENCOUNTER — Other Ambulatory Visit (HOSPITAL_COMMUNITY): Payer: Self-pay

## 2021-04-05 DIAGNOSIS — I10 Essential (primary) hypertension: Secondary | ICD-10-CM

## 2021-04-05 MED ORDER — CARVEDILOL 12.5 MG PO TABS
12.5000 mg | ORAL_TABLET | Freq: Two times a day (BID) | ORAL | 1 refills | Status: DC
  Filled 2021-04-05 – 2021-07-22 (×2): qty 180, 90d supply, fill #0

## 2021-04-06 ENCOUNTER — Other Ambulatory Visit (HOSPITAL_COMMUNITY): Payer: Self-pay

## 2021-04-14 ENCOUNTER — Other Ambulatory Visit (HOSPITAL_COMMUNITY): Payer: Self-pay

## 2021-05-05 ENCOUNTER — Ambulatory Visit: Payer: No Typology Code available for payment source | Admitting: Internal Medicine

## 2021-07-14 ENCOUNTER — Other Ambulatory Visit (HOSPITAL_COMMUNITY): Payer: Self-pay

## 2021-07-14 ENCOUNTER — Other Ambulatory Visit: Payer: Self-pay | Admitting: Internal Medicine

## 2021-07-14 DIAGNOSIS — I10 Essential (primary) hypertension: Secondary | ICD-10-CM

## 2021-07-14 MED ORDER — TRIAMTERENE-HCTZ 37.5-25 MG PO TABS
1.0000 | ORAL_TABLET | Freq: Every day | ORAL | 0 refills | Status: DC
  Filled 2021-07-22: qty 90, 90d supply, fill #0

## 2021-07-22 ENCOUNTER — Other Ambulatory Visit (HOSPITAL_COMMUNITY): Payer: Self-pay

## 2021-08-11 ENCOUNTER — Other Ambulatory Visit (HOSPITAL_COMMUNITY): Payer: Self-pay

## 2021-08-11 ENCOUNTER — Ambulatory Visit (INDEPENDENT_AMBULATORY_CARE_PROVIDER_SITE_OTHER): Payer: No Typology Code available for payment source | Admitting: Internal Medicine

## 2021-08-11 ENCOUNTER — Ambulatory Visit (INDEPENDENT_AMBULATORY_CARE_PROVIDER_SITE_OTHER): Payer: No Typology Code available for payment source

## 2021-08-11 ENCOUNTER — Encounter: Payer: Self-pay | Admitting: Internal Medicine

## 2021-08-11 VITALS — BP 128/84 | HR 92 | Temp 98.7°F | Ht 64.0 in | Wt 228.4 lb

## 2021-08-11 DIAGNOSIS — R053 Chronic cough: Secondary | ICD-10-CM | POA: Diagnosis not present

## 2021-08-11 DIAGNOSIS — J4520 Mild intermittent asthma, uncomplicated: Secondary | ICD-10-CM | POA: Diagnosis not present

## 2021-08-11 MED ORDER — HYDROCODONE BIT-HOMATROP MBR 5-1.5 MG/5ML PO SOLN
5.0000 mL | Freq: Three times a day (TID) | ORAL | 0 refills | Status: AC | PRN
  Filled 2021-08-11: qty 105, 7d supply, fill #0

## 2021-08-11 MED ORDER — FLUTICASONE-SALMETEROL 100-50 MCG/ACT IN AEPB
1.0000 | INHALATION_SPRAY | Freq: Two times a day (BID) | RESPIRATORY_TRACT | 1 refills | Status: DC
Start: 2021-08-11 — End: 2022-06-08
  Filled 2021-08-11: qty 60, 30d supply, fill #0
  Filled 2021-10-17 – 2021-11-01 (×2): qty 60, 30d supply, fill #1
  Filled 2021-12-26: qty 60, 30d supply, fill #2
  Filled 2022-02-21 – 2022-03-03 (×2): qty 180, 90d supply, fill #3

## 2021-08-11 MED ORDER — METHYLPREDNISOLONE ACETATE 40 MG/ML IJ SUSP
120.0000 mg | Freq: Once | INTRAMUSCULAR | Status: AC
  Administered 2021-08-11: 120 mg via INTRAMUSCULAR

## 2021-08-11 NOTE — Progress Notes (Signed)
After obtaining consent, and per orders of Dr. Yetta Barre , injection of Depo-Medrol was given on the left side of ventrogluteal by Ferdie Ping . Patient tolerated well and was instructed  to report any adverse reaction to me immediately.

## 2021-08-11 NOTE — Progress Notes (Signed)
Subjective:  Patient ID: Penny Ramirez, female    DOB: [birth year]  Age: 51 y.o. MRN: [id number]  CC: Dry Cough (Started a month ago. This week is when the wheezing started. She feels it more in her throat. Today the wheezing is not that bad.)   HPI Penny Ramirez presents for f/up -  She complains of a 1 month history of nonproductive cough with intermittent chest tightness, wheezing, and nasal congestion.  She denies chest pain, hemoptysis, fever, chills, or night sweats.  Her recent CBC showed a slight increase in eosinophils.  Outpatient Medications Prior to Visit  Medication Sig Dispense Refill   Blood Pressure Monitoring (OMRON 3 SERIES BP MONITOR) DEVI Use as directed to check blood pressure daily. 1 each 0   carvedilol (COREG) 12.5 MG tablet Take 1 tablet (12.5 mg total) by mouth 2 (two) times daily with a meal. 180 tablet 1   triamterene-hydrochlorothiazide (MAXZIDE-25) 37.5-25 MG tablet Take 1 tablet by mouth daily. 90 tablet 0   No facility-administered medications prior to visit.    ROS Review of Systems  Constitutional: Negative.  Negative for chills, diaphoresis, fatigue and fever.  HENT: Negative.    Eyes: Negative.   Respiratory:  Positive for cough, chest tightness and wheezing. Negative for choking and shortness of breath.   Cardiovascular:  Negative for chest pain, palpitations and leg swelling.  Gastrointestinal:  Negative for abdominal pain, constipation, diarrhea, nausea and vomiting.  Endocrine: Negative.   Genitourinary: Negative.   Musculoskeletal: Negative.  Negative for arthralgias, joint swelling and myalgias.  Skin: Negative.  Negative for color change.  Neurological: Negative.  Negative for dizziness and weakness.  Hematological:  Negative for adenopathy. Does not bruise/bleed easily.  Psychiatric/Behavioral: Negative.      Objective:  BP 128/84   Pulse 92   Temp 98.7 F (37.1 C) (Oral)   Ht 5\' 4"  (1.626 m)   Wt 228 lb 6.4 oz (103.6 kg)    LMP 08/03/2021 (Exact Date)   SpO2 97%   BMI 39.20 kg/m   BP Readings from Last 3 Encounters:  08/11/21 128/84  04/04/21 (!) 158/96  09/20/20 132/70    Wt Readings from Last 3 Encounters:  08/11/21 228 lb 6.4 oz (103.6 kg)  04/04/21 237 lb (107.5 kg)  09/20/20 232 lb (105.2 kg)    Physical Exam Vitals reviewed.  Constitutional:      Appearance: She is not ill-appearing.  HENT:     Mouth/Throat:     Mouth: Mucous membranes are moist.  Eyes:     General: No scleral icterus.    Conjunctiva/sclera: Conjunctivae normal.  Cardiovascular:     Rate and Rhythm: Normal rate and regular rhythm.     Heart sounds: No murmur heard. Pulmonary:     Effort: Pulmonary effort is normal.     Breath sounds: No stridor. No wheezing, rhonchi or rales.  Abdominal:     General: Abdomen is flat.     Palpations: There is no mass.     Tenderness: There is no abdominal tenderness. There is no guarding.     Hernia: No hernia is present.  Musculoskeletal:        General: Normal range of motion.     Cervical back: Neck supple.     Right lower leg: No edema.     Left lower leg: No edema.  Lymphadenopathy:     Cervical: No cervical adenopathy.  Skin:    General: Skin is warm and dry.  Coloration: Skin is not pale.  Neurological:     General: No focal deficit present.     Mental Status: She is alert. Mental status is at baseline.  Psychiatric:        Mood and Affect: Mood normal.        Behavior: Behavior normal.     Lab Results  Component Value Date   WBC 9.0 04/04/2021   HGB 13.2 04/04/2021   HCT 39.3 04/04/2021   PLT 331.0 04/04/2021   GLUCOSE 93 04/04/2021   CHOL 184 09/20/2020   TRIG 72.0 09/20/2020   HDL 43.30 09/20/2020   LDLCALC 126 (H) 09/20/2020   ALT 15 09/20/2020   AST 18 09/20/2020   NA 138 04/04/2021   K 3.6 04/04/2021   CL 100 04/04/2021   CREATININE 0.94 04/04/2021   BUN 11 04/04/2021   CO2 31 04/04/2021   TSH 1.81 09/20/2020   HGBA1C 6.2 04/04/2021     MM 3D SCREEN BREAST BILATERAL  Result Date: 01/19/2021 CLINICAL DATA:  Screening. EXAM: DIGITAL SCREENING BILATERAL MAMMOGRAM WITH TOMOSYNTHESIS AND CAD TECHNIQUE: Bilateral screening digital craniocaudal and mediolateral oblique mammograms were obtained. Bilateral screening digital breast tomosynthesis was performed. The images were evaluated with computer-aided detection. COMPARISON:  Previous exam(s). ACR Breast Density Category b: There are scattered areas of fibroglandular density. FINDINGS: There are no findings suspicious for malignancy. IMPRESSION: No mammographic evidence of malignancy. A result letter of this screening mammogram will be mailed directly to the patient. RECOMMENDATION: Screening mammogram in one year. (Code:SM-B-01Y) BI-RADS CATEGORY  1: Negative. Electronically Signed   By: Ted Mcalpine M.D.   On: 01/19/2021 10:54   DG Chest 2 View  Result Date: 08/13/2021 CLINICAL DATA:  Nonproductive cough for several months EXAM: CHEST - 2 VIEW COMPARISON:  None Available. FINDINGS: The heart size and mediastinal contours are within normal limits. Both lungs are clear. The visualized skeletal structures are unremarkable. IMPRESSION: No acute abnormality of the lungs. Electronically Signed   By: Jearld Lesch M.D.   On: 08/13/2021 12:52     Assessment & Plan:   Penny Ramirez was seen today for dry cough.  Diagnoses and all orders for this visit:  Chronic cough- Her chest x-ray is negative for mass or infiltrate.  Will treat for asthma with acute exacerbation. -     DG Chest 2 View; Future -     HYDROcodone bit-homatropine (HYCODAN) 5-1.5 MG/5ML syrup; Take 5 mLs by mouth every 8 (eight) hours as needed for up to 7 days for cough. -     methylPREDNISolone acetate (DEPO-MEDROL) injection 120 mg  Mild intermittent asthma without complication -     fluticasone-salmeterol (ADVAIR DISKUS) 100-50 MCG/ACT AEPB; Inhale 1 puff into the lungs 2 (two) times daily.   I am having Penny Ramirez start on fluticasone-salmeterol and HYDROcodone bit-homatropine. I am also having her maintain her Omron 3 Series BP Monitor, carvedilol, and triamterene-hydrochlorothiazide. We administered methylPREDNISolone acetate.  Meds ordered this encounter  Medications   fluticasone-salmeterol (ADVAIR DISKUS) 100-50 MCG/ACT AEPB    Sig: Inhale 1 puff into the lungs 2 (two) times daily.    Dispense:  180 each    Refill:  1   HYDROcodone bit-homatropine (HYCODAN) 5-1.5 MG/5ML syrup    Sig: Take 5 mLs by mouth every 8 (eight) hours as needed for up to 7 days for cough.    Dispense:  105 mL    Refill:  0   methylPREDNISolone acetate (DEPO-MEDROL) injection 120 mg  Follow-up: Return in about 3 months (around 11/11/2021).  Sanda Linger, MD

## 2021-08-11 NOTE — Patient Instructions (Signed)

## 2021-09-21 ENCOUNTER — Other Ambulatory Visit (HOSPITAL_COMMUNITY): Payer: Self-pay

## 2021-09-21 ENCOUNTER — Ambulatory Visit (INDEPENDENT_AMBULATORY_CARE_PROVIDER_SITE_OTHER): Payer: No Typology Code available for payment source | Admitting: Internal Medicine

## 2021-09-21 ENCOUNTER — Encounter: Payer: Self-pay | Admitting: Internal Medicine

## 2021-09-21 VITALS — BP 136/82 | HR 69 | Temp 97.6°F | Ht 64.0 in | Wt 229.0 lb

## 2021-09-21 DIAGNOSIS — Z23 Encounter for immunization: Secondary | ICD-10-CM

## 2021-09-21 DIAGNOSIS — R7303 Prediabetes: Secondary | ICD-10-CM | POA: Diagnosis not present

## 2021-09-21 DIAGNOSIS — E876 Hypokalemia: Secondary | ICD-10-CM | POA: Diagnosis not present

## 2021-09-21 DIAGNOSIS — T502X5A Adverse effect of carbonic-anhydrase inhibitors, benzothiadiazides and other diuretics, initial encounter: Secondary | ICD-10-CM

## 2021-09-21 DIAGNOSIS — B372 Candidiasis of skin and nail: Secondary | ICD-10-CM | POA: Diagnosis not present

## 2021-09-21 DIAGNOSIS — Z0001 Encounter for general adult medical examination with abnormal findings: Secondary | ICD-10-CM | POA: Diagnosis not present

## 2021-09-21 DIAGNOSIS — I10 Essential (primary) hypertension: Secondary | ICD-10-CM | POA: Diagnosis not present

## 2021-09-21 LAB — CBC WITH DIFFERENTIAL/PLATELET
Basophils Absolute: 0.1 10*3/uL (ref 0.0–0.1)
Basophils Relative: 0.8 % (ref 0.0–3.0)
Eosinophils Absolute: 0.5 10*3/uL (ref 0.0–0.7)
Eosinophils Relative: 5.9 % — ABNORMAL HIGH (ref 0.0–5.0)
HCT: 39.1 % (ref 36.0–46.0)
Hemoglobin: 12.9 g/dL (ref 12.0–15.0)
Lymphocytes Relative: 31.8 % (ref 12.0–46.0)
Lymphs Abs: 2.5 10*3/uL (ref 0.7–4.0)
MCHC: 33 g/dL (ref 30.0–36.0)
MCV: 90.5 fl (ref 78.0–100.0)
Monocytes Absolute: 0.5 10*3/uL (ref 0.1–1.0)
Monocytes Relative: 7 % (ref 3.0–12.0)
Neutro Abs: 4.3 10*3/uL (ref 1.4–7.7)
Neutrophils Relative %: 54.5 % (ref 43.0–77.0)
Platelets: 322 10*3/uL (ref 150.0–400.0)
RBC: 4.32 Mil/uL (ref 3.87–5.11)
RDW: 14.4 % (ref 11.5–15.5)
WBC: 7.9 10*3/uL (ref 4.0–10.5)

## 2021-09-21 LAB — LIPID PANEL
Cholesterol: 196 mg/dL (ref 0–200)
HDL: 52.7 mg/dL (ref >39.00)
LDL Cholesterol: 128 mg/dL — ABNORMAL HIGH (ref 0–99)
NonHDL: 143.06
Total CHOL/HDL Ratio: 4
Triglycerides: 75 mg/dL (ref 0.0–149.0)
VLDL: 15 mg/dL (ref 0.0–40.0)

## 2021-09-21 LAB — BASIC METABOLIC PANEL
BUN: 9 mg/dL (ref 6–23)
CO2: 32 mEq/L (ref 19–32)
Calcium: 9.1 mg/dL (ref 8.4–10.5)
Chloride: 99 mEq/L (ref 96–112)
Creatinine, Ser: 0.93 mg/dL (ref 0.40–1.20)
GFR: 72.3 mL/min (ref >60.00)
Glucose, Bld: 84 mg/dL (ref 70–99)
Potassium: 3.7 mEq/L (ref 3.5–5.1)
Sodium: 137 mEq/L (ref 135–145)

## 2021-09-21 LAB — HEPATIC FUNCTION PANEL
ALT: 14 U/L (ref 0–35)
AST: 15 U/L (ref 0–37)
Albumin: 3.9 g/dL (ref 3.5–5.2)
Alkaline Phosphatase: 72 U/L (ref 39–117)
Bilirubin, Direct: 0 mg/dL (ref 0.0–0.3)
Total Bilirubin: 0.5 mg/dL (ref 0.2–1.2)
Total Protein: 7 g/dL (ref 6.0–8.3)

## 2021-09-21 LAB — HEMOGLOBIN A1C: Hgb A1c MFr Bld: 6.2 % (ref 4.6–6.5)

## 2021-09-21 LAB — TSH: TSH: 1.46 u[IU]/mL (ref 0.35–5.50)

## 2021-09-21 MED ORDER — CICLOPIROX OLAMINE 0.77 % EX CREA
TOPICAL_CREAM | Freq: Two times a day (BID) | CUTANEOUS | 3 refills | Status: AC
  Filled 2021-09-21: qty 90, 30d supply, fill #0

## 2021-09-21 MED ORDER — KETOCONAZOLE 200 MG PO TABS
200.0000 mg | ORAL_TABLET | Freq: Every day | ORAL | 1 refills | Status: DC
  Filled 2021-09-21: qty 14, 14d supply, fill #0

## 2021-09-21 MED ORDER — CARVEDILOL 12.5 MG PO TABS
12.5000 mg | ORAL_TABLET | Freq: Two times a day (BID) | ORAL | 1 refills | Status: DC
  Filled 2021-09-21 – 2021-11-01 (×3): qty 180, 90d supply, fill #0
  Filled 2022-02-21 – 2022-03-03 (×2): qty 180, 90d supply, fill #1

## 2021-09-21 NOTE — Patient Instructions (Signed)

## 2021-09-21 NOTE — Progress Notes (Signed)
Subjective:  Patient ID: Penny Ramirez, female    DOB: 08/18/1970  Age: 51 y.o. MRN: [id number]  CC: Annual Exam, Rash, and Hypertension   HPI Penny Ramirez presents for a CPX and f/up -  For 2 weeks she has a rash under both breasts. It has not resolved with hydrocortisone cream.  She reports that her blood pressures been well controlled and she denies headache, blurred vision, chest pain, shortness of breath, or edema.  Outpatient Medications Prior to Visit  Medication Sig Dispense Refill   Blood Pressure Monitoring (OMRON 3 SERIES BP MONITOR) DEVI Use as directed to check blood pressure daily. 1 each 0   fluticasone-salmeterol (ADVAIR DISKUS) 100-50 MCG/ACT AEPB Inhale 1 puff into the lungs 2 (two) times daily. 180 each 1   triamterene-hydrochlorothiazide (MAXZIDE-25) 37.5-25 MG tablet Take 1 tablet by mouth daily. 90 tablet 0   carvedilol (COREG) 12.5 MG tablet Take 1 tablet (12.5 mg total) by mouth 2 (two) times daily with a meal. 180 tablet 1   No facility-administered medications prior to visit.    ROS Review of Systems  Constitutional:  Negative for chills, diaphoresis, fatigue and fever.  HENT: Negative.    Respiratory:  Negative for cough, chest tightness, shortness of breath and wheezing.   Cardiovascular:  Negative for chest pain, palpitations and leg swelling.  Gastrointestinal:  Negative for abdominal pain, diarrhea, nausea and vomiting.  Genitourinary:  Negative for difficulty urinating.  Musculoskeletal: Negative.  Negative for myalgias.  Skin:  Positive for rash. Negative for color change.  Neurological: Negative.  Negative for dizziness, weakness, light-headedness and headaches.  Hematological:  Negative for adenopathy. Does not bruise/bleed easily.  Psychiatric/Behavioral: Negative.      Objective:  BP 136/82 (BP Location: Left Arm, Patient Position: Sitting, Cuff Size: Large)   Pulse 69   Temp 97.6 F (36.4 C) (Oral)   Ht 5\' 4"  (1.626 m)   Wt 229 lb  (103.9 kg)   SpO2 98%   BMI 39.31 kg/m   BP Readings from Last 3 Encounters:  09/21/21 136/82  08/11/21 128/84  04/04/21 (!) 158/96    Wt Readings from Last 3 Encounters:  09/21/21 229 lb (103.9 kg)  08/11/21 228 lb 6.4 oz (103.6 kg)  04/04/21 237 lb (107.5 kg)    Physical Exam Vitals reviewed. Exam conducted with a chaperone present 06/04/21).  Constitutional:      Appearance: She is not ill-appearing.  HENT:     Mouth/Throat:     Mouth: Mucous membranes are moist.  Eyes:     General: No scleral icterus.    Conjunctiva/sclera: Conjunctivae normal.  Cardiovascular:     Rate and Rhythm: Normal rate and regular rhythm.     Heart sounds: No murmur heard. Pulmonary:     Effort: Pulmonary effort is normal.     Breath sounds: No stridor. No wheezing, rhonchi or rales.  Chest:    Abdominal:     General: Abdomen is protuberant. Bowel sounds are normal. There is no distension.     Palpations: Abdomen is soft. There is no hepatomegaly, splenomegaly or mass.     Tenderness: There is no abdominal tenderness.  Musculoskeletal:        General: Normal range of motion.     Cervical back: Neck supple.     Right lower leg: No edema.     Left lower leg: No edema.  Lymphadenopathy:     Cervical: No cervical adenopathy.  Skin:    General:  Skin is warm and dry.     Findings: Rash present.  Neurological:     General: No focal deficit present.     Mental Status: She is alert. Mental status is at baseline.  Psychiatric:        Mood and Affect: Mood normal.        Behavior: Behavior normal.     Lab Results  Component Value Date   WBC 7.9 09/21/2021   HGB 12.9 09/21/2021   HCT 39.1 09/21/2021   PLT 322.0 09/21/2021   GLUCOSE 84 09/21/2021   CHOL 196 09/21/2021   TRIG 75.0 09/21/2021   HDL 52.70 09/21/2021   LDLCALC 128 (H) 09/21/2021   ALT 14 09/21/2021   AST 15 09/21/2021   NA 137 09/21/2021   K 3.7 09/21/2021   CL 99 09/21/2021   CREATININE 0.93 09/21/2021   BUN  9 09/21/2021   CO2 32 09/21/2021   TSH 1.46 09/21/2021   HGBA1C 6.2 09/21/2021    MM 3D SCREEN BREAST BILATERAL  Result Date: 01/19/2021 CLINICAL DATA:  Screening. EXAM: DIGITAL SCREENING BILATERAL MAMMOGRAM WITH TOMOSYNTHESIS AND CAD TECHNIQUE: Bilateral screening digital craniocaudal and mediolateral oblique mammograms were obtained. Bilateral screening digital breast tomosynthesis was performed. The images were evaluated with computer-aided detection. COMPARISON:  Previous exam(s). ACR Breast Density Category b: There are scattered areas of fibroglandular density. FINDINGS: There are no findings suspicious for malignancy. IMPRESSION: No mammographic evidence of malignancy. A result letter of this screening mammogram will be mailed directly to the patient. RECOMMENDATION: Screening mammogram in one year. (Code:SM-B-01Y) BI-RADS CATEGORY  1: Negative. Electronically Signed   By: Ted Mcalpine M.D.   On: 01/19/2021 10:54    Assessment & Plan:   Penny Ramirez was seen today for annual exam, rash and hypertension.  Diagnoses and all orders for this visit:  Essential hypertension, benign- Her blood pressure is adequately well controlled. -     carvedilol (COREG) 12.5 MG tablet; Take 1 tablet (12.5 mg total) by mouth 2 (two) times daily with a meal. -     Basic metabolic panel; Future -     CBC with Differential/Platelet; Future -     CBC with Differential/Platelet -     Basic metabolic panel  Diuretic-induced hypokalemia -     Basic metabolic panel; Future -     Basic metabolic panel  Severe obesity (BMI >= 40) (HCC)- Labs are negative for secondary causes. -     TSH; Future -     Hepatic function panel; Future -     Hemoglobin A1c; Future -     Hemoglobin A1c -     Hepatic function panel -     TSH  Encounter for general adult medical examination with abnormal findings- Exam completed, labs reviewed, vaccines reviewed and updated, cancer screenings are up-to-date, patient education  was given. -     Lipid panel; Future -     Lipid panel  Prediabetes -     Basic metabolic panel; Future -     Hemoglobin A1c; Future -     Hemoglobin A1c -     Basic metabolic panel  Need for vaccination -     Pneumococcal conjugate vaccine 20-valent (Prevnar 20)  Candidal skin infection -     Discontinue: ketoconazole (NIZORAL) 200 MG tablet; Take 1 tablet (200 mg total) by mouth daily for 14 days. -     ciclopirox (LOPROX) 0.77 % cream; Apply topically 2 (two) times daily.   I have  discontinued Jamaia K. Shuster's ketoconazole. I am also having her start on ciclopirox. Additionally, I am having her maintain her Omron 3 Series BP Monitor, triamterene-hydrochlorothiazide, fluticasone-salmeterol, and carvedilol.  Meds ordered this encounter  Medications   DISCONTD: ketoconazole (NIZORAL) 200 MG tablet    Sig: Take 1 tablet (200 mg total) by mouth daily for 14 days.    Dispense:  14 tablet    Refill:  1   carvedilol (COREG) 12.5 MG tablet    Sig: Take 1 tablet (12.5 mg total) by mouth 2 (two) times daily with a meal.    Dispense:  180 tablet    Refill:  1   ciclopirox (LOPROX) 0.77 % cream    Sig: Apply topically 2 (two) times daily.    Dispense:  90 g    Refill:  3     Follow-up: Return in about 6 months (around 03/24/2022).  Sanda Linger, MD

## 2021-09-23 ENCOUNTER — Encounter: Payer: Self-pay | Admitting: Internal Medicine

## 2021-10-17 ENCOUNTER — Other Ambulatory Visit: Payer: Self-pay | Admitting: Internal Medicine

## 2021-10-17 ENCOUNTER — Other Ambulatory Visit (HOSPITAL_COMMUNITY): Payer: Self-pay

## 2021-10-17 DIAGNOSIS — I10 Essential (primary) hypertension: Secondary | ICD-10-CM

## 2021-10-17 MED ORDER — TRIAMTERENE-HCTZ 37.5-25 MG PO TABS
1.0000 | ORAL_TABLET | Freq: Every day | ORAL | 0 refills | Status: DC
  Filled 2021-10-17 – 2021-11-01 (×2): qty 90, 90d supply, fill #0

## 2021-10-25 ENCOUNTER — Other Ambulatory Visit (HOSPITAL_COMMUNITY): Payer: Self-pay

## 2021-11-01 ENCOUNTER — Other Ambulatory Visit (HOSPITAL_COMMUNITY): Payer: Self-pay

## 2021-11-03 ENCOUNTER — Other Ambulatory Visit: Payer: Self-pay | Admitting: Obstetrics and Gynecology

## 2021-11-03 DIAGNOSIS — Z1231 Encounter for screening mammogram for malignant neoplasm of breast: Secondary | ICD-10-CM

## 2021-12-26 ENCOUNTER — Other Ambulatory Visit (HOSPITAL_COMMUNITY): Payer: Self-pay

## 2022-01-20 ENCOUNTER — Ambulatory Visit
Admission: RE | Admit: 2022-01-20 | Discharge: 2022-01-20 | Disposition: A | Discharge status: Home / Self Care | Payer: No Typology Code available for payment source | Source: Ambulatory Visit | Attending: Obstetrics and Gynecology | Admitting: Obstetrics and Gynecology

## 2022-01-20 DIAGNOSIS — Z1231 Encounter for screening mammogram for malignant neoplasm of breast: Secondary | ICD-10-CM

## 2022-02-09 DIAGNOSIS — Z6841 Body Mass Index (BMI) 40.0 and over, adult: Secondary | ICD-10-CM | POA: Diagnosis not present

## 2022-02-09 DIAGNOSIS — Z01419 Encounter for gynecological examination (general) (routine) without abnormal findings: Secondary | ICD-10-CM | POA: Diagnosis not present

## 2022-02-21 ENCOUNTER — Other Ambulatory Visit: Payer: Self-pay | Admitting: Internal Medicine

## 2022-02-21 DIAGNOSIS — I10 Essential (primary) hypertension: Secondary | ICD-10-CM

## 2022-02-22 ENCOUNTER — Other Ambulatory Visit (HOSPITAL_COMMUNITY): Payer: Self-pay

## 2022-02-22 ENCOUNTER — Other Ambulatory Visit: Payer: Self-pay

## 2022-02-22 MED ORDER — TRIAMTERENE-HCTZ 37.5-25 MG PO TABS
1.0000 | ORAL_TABLET | Freq: Every day | ORAL | 0 refills | Status: DC
  Filled 2022-02-22 – 2022-03-03 (×2): qty 90, 90d supply, fill #0

## 2022-03-02 ENCOUNTER — Other Ambulatory Visit (HOSPITAL_COMMUNITY): Payer: Self-pay

## 2022-03-03 ENCOUNTER — Other Ambulatory Visit (HOSPITAL_COMMUNITY): Payer: Self-pay

## 2022-03-23 ENCOUNTER — Telehealth: Payer: 59 | Admitting: Physician Assistant

## 2022-03-23 ENCOUNTER — Other Ambulatory Visit (HOSPITAL_COMMUNITY): Payer: Self-pay

## 2022-03-23 DIAGNOSIS — J069 Acute upper respiratory infection, unspecified: Secondary | ICD-10-CM | POA: Diagnosis not present

## 2022-03-23 MED ORDER — BENZONATATE 100 MG PO CAPS
100.0000 mg | ORAL_CAPSULE | Freq: Three times a day (TID) | ORAL | 0 refills | Status: DC | PRN
  Filled 2022-03-23: qty 30, 10d supply, fill #0

## 2022-03-23 NOTE — Progress Notes (Signed)
I have spent 5 minutes in review of e-visit questionnaire, review and updating patient chart, medical decision making and response to patient.   Birda Didonato Cody Jayah Balthazar, PA-C    

## 2022-03-23 NOTE — Progress Notes (Signed)
We are sorry that you are not feeling well.  Here is how we plan to help!  Based on your presentation I believe you most likely have A cough due to a virus.  This is called viral bronchitis and is best treated by rest, plenty of fluids and control of the cough.  You may use Ibuprofen or Tylenol as directed to help your symptoms.     In addition you may use A prescription cough medication called Tessalon Perles 120m. You may take 1-2 capsules every 8 hours as needed for your cough.  Giving you medical history (asthma, etc), I do recommend COVID testing to be cautious.   From your responses in the eVisit questionnaire you describe inflammation in the upper respiratory tract which is causing a significant cough.  This is commonly called Bronchitis and has four common causes:   Allergies Viral Infections Acid Reflux Bacterial Infection Allergies, viruses and acid reflux are treated by controlling symptoms or eliminating the cause. An example might be a cough caused by taking certain blood pressure medications. You stop the cough by changing the medication. Another example might be a cough caused by acid reflux. Controlling the reflux helps control the cough.  USE OF BRONCHODILATOR ("RESCUE") INHALERS: There is a risk from using your bronchodilator too frequently.  The risk is that over-reliance on a medication which only relaxes the muscles surrounding the breathing tubes can reduce the effectiveness of medications prescribed to reduce swelling and congestion of the tubes themselves.  Although you feel brief relief from the bronchodilator inhaler, your asthma may actually be worsening with the tubes becoming more swollen and filled with mucus.  This can delay other crucial treatments, such as oral steroid medications. If you need to use a bronchodilator inhaler daily, several times per day, you should discuss this with your provider.  There are probably better treatments that could be used to keep your  asthma under control.     HOME CARE Only take medications as instructed by your medical team. Complete the entire course of an antibiotic. Drink plenty of fluids and get plenty of rest. Avoid close contacts especially the very young and the elderly Cover your mouth if you cough or cough into your sleeve. Always remember to wash your hands A steam or ultrasonic humidifier can help congestion.   GET HELP RIGHT AWAY IF: You develop worsening fever. You become short of breath You cough up blood. Your symptoms persist after you have completed your treatment plan MAKE SURE YOU  Understand these instructions. Will watch your condition. Will get help right away if you are not doing well or get worse.    Thank you for choosing an e-visit.  Your e-visit answers were reviewed by a board certified advanced clinical practitioner to complete your personal care plan. Depending upon the condition, your plan could have included both over the counter or prescription medications.  Please review your pharmacy choice. Make sure the pharmacy is open so you can pick up prescription now. If there is a problem, you may contact your provider through [employer] have the prescription routed to another pharmacy.  Your safety is important to [place] If you have drug allergies check your prescription carefully.   For the next 24 hours you can use MyChart to ask questions about today's visit, request a non-urgent call back, or ask for a work or school excuse. You will get an email in the next two days asking about your experience. I hope that  your e-visit has been valuable and will speed your recovery.

## 2022-06-06 ENCOUNTER — Other Ambulatory Visit (HOSPITAL_COMMUNITY): Payer: Self-pay

## 2022-06-06 ENCOUNTER — Other Ambulatory Visit: Payer: Self-pay | Admitting: Internal Medicine

## 2022-06-06 DIAGNOSIS — J4520 Mild intermittent asthma, uncomplicated: Secondary | ICD-10-CM

## 2022-06-06 DIAGNOSIS — I10 Essential (primary) hypertension: Secondary | ICD-10-CM

## 2022-06-08 ENCOUNTER — Other Ambulatory Visit: Payer: Self-pay | Admitting: Internal Medicine

## 2022-06-08 ENCOUNTER — Other Ambulatory Visit (HOSPITAL_COMMUNITY): Payer: Self-pay

## 2022-06-08 DIAGNOSIS — J4520 Mild intermittent asthma, uncomplicated: Secondary | ICD-10-CM

## 2022-06-08 DIAGNOSIS — I10 Essential (primary) hypertension: Secondary | ICD-10-CM

## 2022-06-08 MED ORDER — FLUTICASONE-SALMETEROL 100-50 MCG/ACT IN AEPB
1.0000 | INHALATION_SPRAY | Freq: Two times a day (BID) | RESPIRATORY_TRACT | 0 refills | Status: DC
  Filled 2022-06-08: qty 180, 90d supply, fill #0

## 2022-06-08 MED ORDER — CARVEDILOL 12.5 MG PO TABS
12.5000 mg | ORAL_TABLET | Freq: Two times a day (BID) | ORAL | 0 refills | Status: DC
  Filled 2022-06-08: qty 180, 90d supply, fill #0

## 2022-06-08 MED ORDER — TRIAMTERENE-HCTZ 37.5-25 MG PO TABS
1.0000 | ORAL_TABLET | Freq: Every day | ORAL | 0 refills | Status: DC
  Filled 2022-06-08: qty 90, 90d supply, fill #0

## 2022-06-22 ENCOUNTER — Ambulatory Visit (INDEPENDENT_AMBULATORY_CARE_PROVIDER_SITE_OTHER): Payer: 59

## 2022-06-22 ENCOUNTER — Other Ambulatory Visit (HOSPITAL_COMMUNITY): Payer: Self-pay

## 2022-06-22 ENCOUNTER — Ambulatory Visit (INDEPENDENT_AMBULATORY_CARE_PROVIDER_SITE_OTHER): Payer: 59 | Admitting: Internal Medicine

## 2022-06-22 ENCOUNTER — Encounter: Payer: Self-pay | Admitting: Internal Medicine

## 2022-06-22 VITALS — BP 132/86 | HR 98 | Temp 98.6°F | Resp 16 | Ht 64.0 in | Wt 229.0 lb

## 2022-06-22 DIAGNOSIS — R7303 Prediabetes: Secondary | ICD-10-CM

## 2022-06-22 DIAGNOSIS — M25511 Pain in right shoulder: Secondary | ICD-10-CM | POA: Diagnosis not present

## 2022-06-22 DIAGNOSIS — H60541 Acute eczematoid otitis externa, right ear: Secondary | ICD-10-CM | POA: Diagnosis not present

## 2022-06-22 DIAGNOSIS — T502X5A Adverse effect of carbonic-anhydrase inhibitors, benzothiadiazides and other diuretics, initial encounter: Secondary | ICD-10-CM | POA: Diagnosis not present

## 2022-06-22 DIAGNOSIS — M19011 Primary osteoarthritis, right shoulder: Secondary | ICD-10-CM | POA: Diagnosis not present

## 2022-06-22 DIAGNOSIS — G8929 Other chronic pain: Secondary | ICD-10-CM

## 2022-06-22 DIAGNOSIS — I10 Essential (primary) hypertension: Secondary | ICD-10-CM

## 2022-06-22 DIAGNOSIS — E876 Hypokalemia: Secondary | ICD-10-CM | POA: Diagnosis not present

## 2022-06-22 LAB — CBC WITH DIFFERENTIAL/PLATELET
Basophils Absolute: 0.1 10*3/uL (ref 0.0–0.1)
Basophils Relative: 0.7 % (ref 0.0–3.0)
Eosinophils Absolute: 0.6 10*3/uL (ref 0.0–0.7)
Eosinophils Relative: 7.2 % — ABNORMAL HIGH (ref 0.0–5.0)
HCT: 38.7 % (ref 36.0–46.0)
Hemoglobin: 12.6 g/dL (ref 12.0–15.0)
Lymphocytes Relative: 36.5 % (ref 12.0–46.0)
Lymphs Abs: 3 10*3/uL (ref 0.7–4.0)
MCHC: 32.5 g/dL (ref 30.0–36.0)
MCV: 89.3 fl (ref 78.0–100.0)
Monocytes Absolute: 0.6 10*3/uL (ref 0.1–1.0)
Monocytes Relative: 7.8 % (ref 3.0–12.0)
Neutro Abs: 3.9 10*3/uL (ref 1.4–7.7)
Neutrophils Relative %: 47.8 % (ref 43.0–77.0)
Platelets: 358 10*3/uL (ref 150.0–400.0)
RBC: 4.34 Mil/uL (ref 3.87–5.11)
RDW: 14.1 % (ref 11.5–15.5)
WBC: 8.3 10*3/uL (ref 4.0–10.5)

## 2022-06-22 LAB — BASIC METABOLIC PANEL
BUN: 10 mg/dL (ref 6–23)
CO2: 32 mEq/L (ref 19–32)
Calcium: 9.7 mg/dL (ref 8.4–10.5)
Chloride: 100 mEq/L (ref 96–112)
Creatinine, Ser: 1.04 mg/dL (ref 0.40–1.20)
GFR: 62.89 mL/min (ref >60.00)
Glucose, Bld: 93 mg/dL (ref 70–99)
Potassium: 3.6 mEq/L (ref 3.5–5.1)
Sodium: 138 mEq/L (ref 135–145)

## 2022-06-22 LAB — HEMOGLOBIN A1C: Hgb A1c MFr Bld: 6 % (ref 4.6–6.5)

## 2022-06-22 MED ORDER — NEOMYCIN-POLYMYXIN-HC 1 % OT SOLN
3.0000 [drp] | Freq: Three times a day (TID) | OTIC | 1 refills | Status: DC
  Filled 2022-06-22: qty 10, 10d supply, fill #0

## 2022-06-22 MED ORDER — MELOXICAM 7.5 MG PO TABS
7.5000 mg | ORAL_TABLET | Freq: Every day | ORAL | 0 refills | Status: DC
  Filled 2022-06-22: qty 90, 90d supply, fill #0

## 2022-06-22 NOTE — Patient Instructions (Signed)
Shoulder Pain Many things can cause shoulder pain, including: An injury to the shoulder. Overuse of the shoulder. Arthritis. The source of the pain can be: Inflammation. An injury to the shoulder joint. An injury to a tendon, ligament, or bone. Follow these instructions at home: Pay attention to changes in your symptoms. Let your health care provider know about them. Follow these instructions to relieve your pain. If you have a removable sling: Wear the sling as told by your provider. Remove it only as told by your provider. Check the skin around the sling every day. Tell your provider about any concerns. Loosen the sling if your fingers tingle, become numb, or become cold. Keep the sling clean. If the sling is not waterproof: Do not let it get wet. Remove it to shower or bathe. Move your arm as little as possible, but keep your hand moving to prevent swelling. Managing pain, stiffness, and swelling  If told, put ice on the painful area. If you have a removable sling or immobilizer, remove it as told by your provider. Put ice in a plastic bag. Place a towel between your skin and the bag. Leave the ice on for 20 minutes, 2-3 times a day. If your skin turns bright red, remove the ice right away to prevent skin damage. The risk of damage is higher if you cannot feel pain, heat, or cold. Move your fingers often to reduce stiffness and swelling. Squeeze a soft ball or a foam pad as much as possible. This helps to keep the shoulder from swelling. It also helps to strengthen the arm. General instructions Take over-the-counter and prescription medicines only as told by your provider. Exercise may help with pain management. Perform exercises if told by your provider. You may be referred to a physical therapist to help in your recovery process. Keep all follow-up visits in order to avoid any type of permanent shoulder disability or chronic pain problems. Contact a health care provider  if: Your pain is not relieved with medicines. New pain develops in your arm, hand, or fingers. You loosen your sling and your arm, hand, or fingers remain tingly, numb, swollen, or painful. Get help right away if: Your arm, hand, or fingers turn white or blue. This information is not intended to replace advice given to you by your health care provider. Make sure you discuss any questions you have with your health care provider. Document Revised: 08/19/2021 Document Reviewed: 08/19/2021 Elsevier Patient Education  2024 [employer].

## 2022-06-22 NOTE — Progress Notes (Signed)
Subjective:  Patient ID: Penny Ramirez, female    DOB: 1970-12-22  Age: 52 y.o. MRN: [id number]  CC: Hypertension   HPI Penny Ramirez presents for f/up ---  She complains of a several month history of right shoulder pain with slight decrease in the range of motion.  She denies trauma or injury.  She is not getting much symptom relief with Tylenol.  Outpatient Medications Prior to Visit  Medication Sig Dispense Refill   benzonatate (TESSALON) 100 MG capsule Take 1 capsule (100 mg total) by mouth 3 (three) times daily as needed for cough. 30 capsule 0   Blood Pressure Monitoring (OMRON 3 SERIES BP MONITOR) DEVI Use as directed to check blood pressure daily. 1 each 0   carvedilol (COREG) 12.5 MG tablet Take 1 tablet (12.5 mg total) by mouth 2 (two) times daily with a meal. 180 tablet 0   ciclopirox (LOPROX) 0.77 % cream Apply topically 2 (two) times daily. 90 g 3   fluticasone-salmeterol (ADVAIR DISKUS) 100-50 MCG/ACT AEPB Inhale 1 puff into the lungs 2 (two) times daily. 180 each 0   triamterene-hydrochlorothiazide (MAXZIDE-25) 37.5-25 MG tablet Take 1 tablet by mouth daily. 90 tablet 0   No facility-administered medications prior to visit.    ROS Review of Systems  Constitutional:  Negative for chills, diaphoresis, fatigue and fever.  HENT: Negative.         Right ear itches  Respiratory: Negative.  Negative for cough, chest tightness, shortness of breath and wheezing.   Cardiovascular:  Negative for chest pain, palpitations and leg swelling.  Gastrointestinal:  Negative for abdominal pain, constipation, diarrhea, nausea and vomiting.  Genitourinary: Negative.   Musculoskeletal:  Positive for arthralgias. Negative for back pain, myalgias and neck pain.  Neurological: Negative.  Negative for dizziness and weakness.  Hematological:  Negative for adenopathy. Does not bruise/bleed easily.  Psychiatric/Behavioral:  Positive for sleep disturbance.     Objective:  BP 132/86 (BP  Location: Left Arm, Patient Position: Sitting, Cuff Size: Large)   Pulse 98   Temp 98.6 F (37 C) (Oral)   Resp 16   Ht 5\' 4"  (1.626 m)   Wt 229 lb (103.9 kg)   LMP 06/17/2022 (Exact Date)   SpO2 95%   BMI 39.31 kg/m   BP Readings from Last 3 Encounters:  06/22/22 132/86  09/21/21 136/82  08/11/21 128/84    Wt Readings from Last 3 Encounters:  06/22/22 229 lb (103.9 kg)  09/21/21 229 lb (103.9 kg)  08/11/21 228 lb 6.4 oz (103.6 kg)    Physical Exam Vitals reviewed.  Constitutional:      General: She is not in acute distress.    Appearance: She is obese. She is not ill-appearing, toxic-appearing or diaphoretic.  HENT:     Right Ear: Hearing, tympanic membrane and external ear normal.     Left Ear: Hearing, tympanic membrane, ear canal and external ear normal.     Ears:     Comments: Flaking in right EAC    Mouth/Throat:     Mouth: Mucous membranes are moist.  Eyes:     General: No scleral icterus.    Conjunctiva/sclera: Conjunctivae normal.  Cardiovascular:     Rate and Rhythm: Normal rate and regular rhythm.     Heart sounds: No murmur heard. Pulmonary:     Effort: Pulmonary effort is normal.     Breath sounds: No stridor. No wheezing, rhonchi or rales.  Abdominal:     General: Abdomen is  flat.     Palpations: There is no mass.     Tenderness: There is no abdominal tenderness. There is no guarding.     Hernia: No hernia is present.  Musculoskeletal:        General: Normal range of motion.     Right shoulder: Tenderness and bony tenderness present. No swelling, deformity or crepitus.     Left shoulder: No swelling or deformity.     Cervical back: Neck supple.     Right lower leg: No edema.     Left lower leg: No edema.  Lymphadenopathy:     Cervical: No cervical adenopathy.  Skin:    General: Skin is warm and dry.  Neurological:     General: No focal deficit present.     Mental Status: She is alert. Mental status is at baseline.  Psychiatric:         Mood and Affect: Mood normal.        Behavior: Behavior normal.     Lab Results  Component Value Date   WBC 8.3 06/22/2022   HGB 12.6 06/22/2022   HCT 38.7 06/22/2022   PLT 358.0 06/22/2022   GLUCOSE 93 06/22/2022   CHOL 196 09/21/2021   TRIG 75.0 09/21/2021   HDL 52.70 09/21/2021   LDLCALC 128 (H) 09/21/2021   ALT 14 09/21/2021   AST 15 09/21/2021   NA 138 06/22/2022   K 3.6 06/22/2022   CL 100 06/22/2022   CREATININE 1.04 06/22/2022   BUN 10 06/22/2022   CO2 32 06/22/2022   TSH 1.46 09/21/2021   HGBA1C 6.0 06/22/2022    MM 3D SCREEN BREAST BILATERAL  Result Date: 01/24/2022 CLINICAL DATA:  Screening. EXAM: DIGITAL SCREENING BILATERAL MAMMOGRAM WITH TOMOSYNTHESIS AND CAD TECHNIQUE: Bilateral screening digital craniocaudal and mediolateral oblique mammograms were obtained. Bilateral screening digital breast tomosynthesis was performed. The images were evaluated with computer-aided detection. COMPARISON:  Previous exam(s). ACR Breast Density Category c: The breast tissue is heterogeneously dense, which may obscure small masses. FINDINGS: There are no findings suspicious for malignancy. IMPRESSION: No mammographic evidence of malignancy. A result letter of this screening mammogram will be mailed directly to the patient. RECOMMENDATION: Screening mammogram in one year. (Code:SM-B-01Y) BI-RADS CATEGORY  1: Negative. Electronically Signed   By: Annia Belt M.D.   On: 01/24/2022 08:31   DG Shoulder Right  Result Date: 06/22/2022 CLINICAL DATA:  Right shoulder pain for 2-3 months EXAM: RIGHT SHOULDER - 2+ VIEW COMPARISON:  None FINDINGS: Frontal, transscapular, and axillary views of the right shoulder are obtained. No fracture, subluxation, or dislocation. Mild acromioclavicular joint osteoarthritis. Soft tissues are unremarkable. Right chest is clear. IMPRESSION: 1. Mild acromioclavicular joint osteoarthritis.  No acute fracture. Electronically Signed   By: Sharlet Salina M.D.   On:  06/22/2022 16:35     Assessment & Plan:   Chronic right shoulder pain- X-ray shows arthritis in the [facility] joint.  Will treat with an NSAID. -     DG Shoulder Right; Future  Diuretic-induced hypokalemia -     Basic metabolic panel; Future  Essential hypertension, benign- Her blood pressure is well-controlled. -     CBC with Differential/Platelet; Future -     Basic metabolic panel; Future  Prediabetes -     Hemoglobin A1c; Future  Acute eczematoid otitis externa, right ear -     Neomycin-Polymyxin-HC; Place 3 drops into the right ear every 8 (eight) hours.  Dispense: 10 mL; Refill: 1  Arthritis of right acromioclavicular  joint -     Meloxicam; Take 1 tablet (7.5 mg total) by mouth daily.  Dispense: 90 tablet; Refill: 0     Follow-up: Return in about 4 months (around 10/23/2022).  Sanda Linger, MD

## 2022-06-23 ENCOUNTER — Encounter: Payer: Self-pay | Admitting: Internal Medicine

## 2022-09-14 ENCOUNTER — Encounter (INDEPENDENT_AMBULATORY_CARE_PROVIDER_SITE_OTHER): Payer: Self-pay

## 2022-09-22 DIAGNOSIS — H524 Presbyopia: Secondary | ICD-10-CM | POA: Diagnosis not present

## 2022-09-22 DIAGNOSIS — H5203 Hypermetropia, bilateral: Secondary | ICD-10-CM | POA: Diagnosis not present

## 2022-09-22 DIAGNOSIS — H52223 Regular astigmatism, bilateral: Secondary | ICD-10-CM | POA: Diagnosis not present

## 2022-09-25 ENCOUNTER — Encounter: Payer: 59 | Admitting: Internal Medicine

## 2022-10-03 ENCOUNTER — Other Ambulatory Visit (HOSPITAL_COMMUNITY): Payer: Self-pay

## 2022-10-03 ENCOUNTER — Other Ambulatory Visit: Payer: Self-pay | Admitting: Internal Medicine

## 2022-10-03 DIAGNOSIS — I10 Essential (primary) hypertension: Secondary | ICD-10-CM

## 2022-10-03 DIAGNOSIS — J4520 Mild intermittent asthma, uncomplicated: Secondary | ICD-10-CM

## 2022-10-03 MED ORDER — TRIAMTERENE-HCTZ 37.5-25 MG PO TABS
1.0000 | ORAL_TABLET | Freq: Every day | ORAL | 0 refills | Status: DC
  Filled 2022-10-03 – 2022-10-16 (×2): qty 90, 90d supply, fill #0

## 2022-10-03 MED ORDER — FLUTICASONE-SALMETEROL 100-50 MCG/ACT IN AEPB
1.0000 | INHALATION_SPRAY | Freq: Two times a day (BID) | RESPIRATORY_TRACT | 0 refills | Status: DC
  Filled 2022-10-03 – 2022-10-16 (×2): qty 180, 90d supply, fill #0

## 2022-10-03 MED ORDER — CARVEDILOL 12.5 MG PO TABS
12.5000 mg | ORAL_TABLET | Freq: Two times a day (BID) | ORAL | 0 refills | Status: DC
  Filled 2022-10-03 – 2022-10-16 (×2): qty 180, 90d supply, fill #0

## 2022-10-12 ENCOUNTER — Other Ambulatory Visit (HOSPITAL_COMMUNITY): Payer: Self-pay

## 2022-10-16 ENCOUNTER — Other Ambulatory Visit (HOSPITAL_COMMUNITY): Payer: Self-pay

## 2022-10-23 ENCOUNTER — Other Ambulatory Visit: Payer: Self-pay | Admitting: Obstetrics and Gynecology

## 2022-10-23 ENCOUNTER — Encounter: Payer: 59 | Admitting: Internal Medicine

## 2022-10-23 DIAGNOSIS — Z1231 Encounter for screening mammogram for malignant neoplasm of breast: Secondary | ICD-10-CM

## 2022-10-25 ENCOUNTER — Ambulatory Visit (INDEPENDENT_AMBULATORY_CARE_PROVIDER_SITE_OTHER): Payer: 59 | Admitting: Internal Medicine

## 2022-10-25 ENCOUNTER — Encounter: Payer: Self-pay | Admitting: Internal Medicine

## 2022-10-25 VITALS — BP 148/78 | HR 82 | Temp 98.1°F | Resp 16 | Ht 64.0 in | Wt 225.0 lb

## 2022-10-25 DIAGNOSIS — Z1211 Encounter for screening for malignant neoplasm of colon: Secondary | ICD-10-CM | POA: Insufficient documentation

## 2022-10-25 DIAGNOSIS — R9431 Abnormal electrocardiogram [ECG] [EKG]: Secondary | ICD-10-CM

## 2022-10-25 DIAGNOSIS — T502X5A Adverse effect of carbonic-anhydrase inhibitors, benzothiadiazides and other diuretics, initial encounter: Secondary | ICD-10-CM

## 2022-10-25 DIAGNOSIS — E785 Hyperlipidemia, unspecified: Secondary | ICD-10-CM

## 2022-10-25 DIAGNOSIS — I10 Essential (primary) hypertension: Secondary | ICD-10-CM

## 2022-10-25 DIAGNOSIS — E876 Hypokalemia: Secondary | ICD-10-CM

## 2022-10-25 DIAGNOSIS — R7303 Prediabetes: Secondary | ICD-10-CM | POA: Diagnosis not present

## 2022-10-25 LAB — URINALYSIS, ROUTINE W REFLEX MICROSCOPIC
Bilirubin Urine: NEGATIVE
Hgb urine dipstick: NEGATIVE
Ketones, ur: NEGATIVE
Nitrite: NEGATIVE
Specific Gravity, Urine: 1.01 (ref 1.000–1.030)
Total Protein, Urine: NEGATIVE
Urine Glucose: NEGATIVE
Urobilinogen, UA: 0.2 (ref 0.0–1.0)
pH: 7.5 (ref 5.0–8.0)

## 2022-10-25 LAB — HEPATIC FUNCTION PANEL
ALT: 15 U/L (ref 0–35)
AST: 16 U/L (ref 0–37)
Albumin: 4.2 g/dL (ref 3.5–5.2)
Alkaline Phosphatase: 81 U/L (ref 39–117)
Bilirubin, Direct: 0.1 mg/dL (ref 0.0–0.3)
Total Bilirubin: 0.5 mg/dL (ref 0.2–1.2)
Total Protein: 7.5 g/dL (ref 6.0–8.3)

## 2022-10-25 LAB — BASIC METABOLIC PANEL
BUN: 11 mg/dL (ref 6–23)
CO2: 30 mEq/L (ref 19–32)
Calcium: 9.8 mg/dL (ref 8.4–10.5)
Chloride: 102 mEq/L (ref 96–112)
Creatinine, Ser: 1.04 mg/dL (ref 0.40–1.20)
GFR: 62.74 mL/min (ref >60.00)
Glucose, Bld: 105 mg/dL — ABNORMAL HIGH (ref 70–99)
Potassium: 3.9 mEq/L (ref 3.5–5.1)
Sodium: 140 mEq/L (ref 135–145)

## 2022-10-25 LAB — HEMOGLOBIN A1C: Hgb A1c MFr Bld: 6.2 % (ref 4.6–6.5)

## 2022-10-25 LAB — LIPID PANEL
Cholesterol: 218 mg/dL — ABNORMAL HIGH (ref 0–200)
HDL: 47.6 mg/dL (ref >39.00)
LDL Cholesterol: 153 mg/dL — ABNORMAL HIGH (ref 0–99)
NonHDL: 170.13
Total CHOL/HDL Ratio: 5
Triglycerides: 87 mg/dL (ref 0.0–149.0)
VLDL: 17.4 mg/dL (ref 0.0–40.0)

## 2022-10-25 LAB — TSH: TSH: 1.3 u[IU]/mL (ref 0.35–5.50)

## 2022-10-25 NOTE — Patient Instructions (Signed)
Long QT Syndrome  Long QT syndrome (LQTS) is a heart condition in which the heart takes longer than normal to recharge after each heartbeat. This is caused by an abnormal electrical system in the heart. LQTS can upset the timing of your heartbeats. It can cause dangerous changes in your heart rate and rhythm (arrhythmia) that can lead to cardiac arrest. You can be born with LQTS, or you can develop it later in life. What are the causes? The cause of this condition depends on the type of LQTS that you have. Inherited LQTS. You are born with this condition. It is caused by an abnormal gene that is passed down through your family. Acquired LQTS. You get this condition later in life. It may be caused by: Certain medicines that can affect your heartbeat, like antibiotics and antidepressants. An electrolyte imbalance. Long periods of vomiting or diarrhea. A thyroid disorder. An eating disorder. What increases the risk? This condition is more likely to develop in: Women. People who have an eating disorder, such as anorexia nervosa or bulimia. People who have a family member with LQTS. People who have a family history of unexplained fainting, drowning, or sudden death. What are the signs or symptoms? Symptoms of this condition include: Fainting. A fluttering feeling in your chest. Seizures. Symptoms of inherited LQTS almost always start before age 64.  Some people with this condition have no symptoms. How is this diagnosed? This condition may be diagnosed based on: Your symptoms. Your medical history and family history. A physical exam. Some tests, including: An electrocardiogram (ECG) to measure electrical activity in your heart. Holter monitoring to record your heartbeat for 1-2 days. A stress test to record your heartbeat while you exercise. A blood test to look for genes that cause LQTS. How is this treated? There is no cure for this condition. Treatment depends on the cause, your  symptoms, and whether you have a family history of sudden death. Treatment may include: Making lifestyle changes, such as avoiding competitive sports. Taking supplements to correct abnormal sodium, potassium, calcium, and magnesium levels. Avoiding swimming and diving. Taking heart medicines, such as beta blockers. Having a device implanted that corrects a dangerous heartbeat called a cardioverter-defibrillator (ICD). This senses a fast heartbeat and shocks the heart to restore normal heart rate. Having heart surgery to prevent arrhythmias. Follow these instructions at home: Medicines Take over-the-counter and prescription medicines only as told by your health care provider. Before taking any new medicine, get approval from your health care provider first. Avoid any medicines that can cause this condition. Lifestyle Make lifestyle changes recommended by your health care provider. You may need to avoid: Stressful situations. Situations where sudden loud noises are likely. If you drink alcohol: Limit how much you have to: 0-1 drink a day for women who are not pregnant. 0-2 drinks a day for men. Know how much alcohol is in a drink. In the [place] one drink equals one 12 oz bottle of beer (355 mL), one 5 oz glass of wine (148 mL), or one 1 oz glass of hard liquor (44 mL). Do not use any products that contain nicotine or tobacco. These products include cigarettes, chewing tobacco, and vaping devices, such as e-cigarettes. If you need help quitting, ask your health care provider. General instructions Develop a plan with your health care provider for how to deal with a sudden arrhythmia. Tell people who live with you about the signs of a sudden arrhythmia. Wear a medical ID necklace or bracelet that  states your diagnosis and contact information. Have an automated external defibrillator (AED) available at home or work. Get treatment and support if you feel stress, fear, anxiety, or depression. Keep  all follow-up visits. This is important. Contact a health care provider if: You are suffering from stress, fear, anxiety, or depression. You have long periods of vomiting or diarrhea. Get help right away if: You have chest pain or difficulty breathing. You have a fluttering feeling in your chest. You faint. You have a seizure. These symptoms may be an emergency. Get help right away. Call 911. Do not wait to see if the symptoms will go away. Do not drive yourself to the hospital. Summary Long QT syndrome (LQTS) is a heart condition in which your heart takes longer than normal to recharge after each heartbeat. This is caused by an abnormal electrical system in your heart. LQTS can upset the timing of your heartbeats and cause dangerous changes in your heart rate and rhythm. Some people are born with LQTS. Others develop it later in life. Some people with this condition have no symptoms. Those who do have symptoms may experience fainting, a fluttering feeling in the chest, or seizures. There is no cure for this condition. Treatment depends on the cause, your symptoms, and whether you have a family history of sudden death. This information is not intended to replace advice given to you by your health care provider. Make sure you discuss any questions you have with your health care provider. Document Revised: 12/16/2020 Document Reviewed: 12/16/2020 Elsevier Patient Education  2024 [employer].

## 2022-10-26 ENCOUNTER — Other Ambulatory Visit (HOSPITAL_COMMUNITY): Payer: Self-pay

## 2022-10-26 MED ORDER — ATORVASTATIN CALCIUM 10 MG PO TABS
10.0000 mg | ORAL_TABLET | Freq: Every day | ORAL | 1 refills | Status: DC
  Filled 2022-10-26: qty 90, 90d supply, fill #0
  Filled 2023-01-09: qty 90, 90d supply, fill #1

## 2022-10-30 DIAGNOSIS — Z1211 Encounter for screening for malignant neoplasm of colon: Secondary | ICD-10-CM | POA: Diagnosis not present

## 2022-11-03 ENCOUNTER — Other Ambulatory Visit: Payer: Self-pay | Admitting: Internal Medicine

## 2022-11-03 DIAGNOSIS — Z1212 Encounter for screening for malignant neoplasm of rectum: Secondary | ICD-10-CM

## 2022-11-03 DIAGNOSIS — Z1211 Encounter for screening for malignant neoplasm of colon: Secondary | ICD-10-CM

## 2022-11-06 LAB — COLOGUARD: COLOGUARD: NEGATIVE

## 2022-11-21 ENCOUNTER — Emergency Department (HOSPITAL_COMMUNITY)
Admission: EM | Admit: 2022-11-21 | Discharge: 2022-11-21 | Disposition: A | Discharge status: Home / Self Care | Payer: 59 | Attending: Emergency Medicine | Admitting: Emergency Medicine

## 2022-11-21 ENCOUNTER — Encounter (HOSPITAL_COMMUNITY): Payer: Self-pay

## 2022-11-21 ENCOUNTER — Other Ambulatory Visit: Payer: Self-pay

## 2022-11-21 DIAGNOSIS — R457 State of emotional shock and stress, unspecified: Secondary | ICD-10-CM | POA: Diagnosis not present

## 2022-11-21 DIAGNOSIS — R682 Dry mouth, unspecified: Secondary | ICD-10-CM | POA: Insufficient documentation

## 2022-11-21 DIAGNOSIS — I10 Essential (primary) hypertension: Secondary | ICD-10-CM | POA: Diagnosis not present

## 2022-11-21 DIAGNOSIS — Z5321 Procedure and treatment not carried out due to patient leaving prior to being seen by health care provider: Secondary | ICD-10-CM | POA: Diagnosis not present

## 2022-11-21 NOTE — ED Triage Notes (Signed)
Pt to ED by EMS from home with c/o dry irritated mouth. Pt woke from her sleep tonight with a feeling of an enlarged tongue and dry mouth. Pt has a history of anxiety and states she took her BP after the incident and noted it was also high (unsure if this was because of anxiety). Arrives A+O, VSS, NADN, airway is patent, no swelling noted, pt is talking in complete sentences.

## 2022-11-23 ENCOUNTER — Encounter: Payer: Self-pay | Admitting: Internal Medicine

## 2022-11-23 ENCOUNTER — Ambulatory Visit: Payer: 59 | Admitting: Internal Medicine

## 2022-11-23 ENCOUNTER — Other Ambulatory Visit (HOSPITAL_COMMUNITY): Payer: Self-pay

## 2022-11-23 ENCOUNTER — Ambulatory Visit: Payer: 59

## 2022-11-23 VITALS — BP 138/86 | HR 81 | Temp 98.2°F | Resp 16 | Ht 64.0 in | Wt 221.4 lb

## 2022-11-23 DIAGNOSIS — R0683 Snoring: Secondary | ICD-10-CM | POA: Diagnosis not present

## 2022-11-23 DIAGNOSIS — R051 Acute cough: Secondary | ICD-10-CM | POA: Insufficient documentation

## 2022-11-23 DIAGNOSIS — I10 Essential (primary) hypertension: Secondary | ICD-10-CM | POA: Diagnosis not present

## 2022-11-23 DIAGNOSIS — J069 Acute upper respiratory infection, unspecified: Secondary | ICD-10-CM | POA: Insufficient documentation

## 2022-11-23 MED ORDER — HYDROCODONE BIT-HOMATROP MBR 5-1.5 MG/5ML PO SOLN
5.0000 mL | Freq: Three times a day (TID) | ORAL | 0 refills | Status: AC | PRN
  Filled 2022-11-23: qty 105, 7d supply, fill #0

## 2022-11-23 NOTE — Patient Instructions (Signed)
Hypertension, Adult High blood pressure (hypertension) is when the force of blood pumping through the arteries is too strong. The arteries are the blood vessels that carry blood from the heart throughout the body. Hypertension forces the heart to work harder to pump blood and may cause arteries to become narrow or stiff. Untreated or uncontrolled hypertension can lead to a heart attack, heart failure, a stroke, kidney disease, and other problems. A blood pressure reading consists of a higher number over a lower number. Ideally, your blood pressure should be below 120/80. The first ("top") number is called the systolic pressure. It is a measure of the pressure in your arteries as your heart beats. The second ("bottom") number is called the diastolic pressure. It is a measure of the pressure in your arteries as the heart relaxes. What are the causes? The exact cause of this condition is not known. There are some conditions that result in high blood pressure. What increases the risk? Certain factors may make you more likely to develop high blood pressure. Some of these risk factors are under your control, including: Smoking. Not getting enough exercise or physical activity. Being overweight. Having too much fat, sugar, calories, or salt (sodium) in your diet. Drinking too much alcohol. Other risk factors include: Having a personal history of heart disease, diabetes, high cholesterol, or kidney disease. Stress. Having a family history of high blood pressure and high cholesterol. Having obstructive sleep apnea. Age. The risk increases with age. What are the signs or symptoms? High blood pressure may not cause symptoms. Very high blood pressure (hypertensive crisis) may cause: Headache. Fast or irregular heartbeats (palpitations). Shortness of breath. Nosebleed. Nausea and vomiting. Vision changes. Severe chest pain, dizziness, and seizures. How is this diagnosed? This condition is diagnosed by  measuring your blood pressure while you are seated, with your arm resting on a flat surface, your legs uncrossed, and your feet flat on the floor. The cuff of the blood pressure monitor will be placed directly against the skin of your upper arm at the level of your heart. Blood pressure should be measured at least twice using the same arm. Certain conditions can cause a difference in blood pressure between your right and left arms. If you have a high blood pressure reading during one visit or you have normal blood pressure with other risk factors, you may be asked to: Return on a different day to have your blood pressure checked again. Monitor your blood pressure at home for 1 week or longer. If you are diagnosed with hypertension, you may have other blood or imaging tests to help your health care provider understand your overall risk for other conditions. How is this treated? This condition is treated by making healthy lifestyle changes, such as eating healthy foods, exercising more, and reducing your alcohol intake. You may be referred for counseling on a healthy diet and physical activity. Your health care provider may prescribe medicine if lifestyle changes are not enough to get your blood pressure under control and if: Your systolic blood pressure is above 130. Your diastolic blood pressure is above 80. Your personal target blood pressure may vary depending on your medical conditions, your age, and other factors. Follow these instructions at home: Eating and drinking  Eat a diet that is high in fiber and potassium, and low in sodium, added sugar, and fat. An example of this eating plan is called the DASH diet. DASH stands for Dietary Approaches to Stop Hypertension. To eat this way: Eat   plenty of fresh fruits and vegetables. Try to fill one half of your plate at each meal with fruits and vegetables. Eat whole grains, such as whole-wheat pasta, brown rice, or whole-grain bread. Fill about one  fourth of your plate with whole grains. Eat or drink low-fat dairy products, such as skim milk or low-fat yogurt. Avoid fatty cuts of meat, processed or cured meats, and poultry with skin. Fill about one fourth of your plate with lean proteins, such as fish, chicken without skin, beans, eggs, or tofu. Avoid pre-made and processed foods. These tend to be higher in sodium, added sugar, and fat. Reduce your daily sodium intake. Many people with hypertension should eat less than 1,500 mg of sodium a day. Do not drink alcohol if: Your health care provider tells you not to drink. You are pregnant, may be pregnant, or are planning to become pregnant. If you drink alcohol: Limit how much you have to: 0-1 drink a day for women. 0-2 drinks a day for men. Know how much alcohol is in your drink. In the [place] one drink equals one 12 oz bottle of beer (355 mL), one 5 oz glass of wine (148 mL), or one 1 oz glass of hard liquor (44 mL). Lifestyle  Work with your health care provider to maintain a healthy body weight or to lose weight. Ask what an ideal weight is for you. Get at least 30 minutes of exercise that causes your heart to beat faster (aerobic exercise) most days of the week. Activities may include walking, swimming, or biking. Include exercise to strengthen your muscles (resistance exercise), such as Pilates or lifting weights, as part of your weekly exercise routine. Try to do these types of exercises for 30 minutes at least 3 days a week. Do not use any products that contain nicotine or tobacco. These products include cigarettes, chewing tobacco, and vaping devices, such as e-cigarettes. If you need help quitting, ask your health care provider. Monitor your blood pressure at home as told by your health care provider. Keep all follow-up visits. This is important. Medicines Take over-the-counter and prescription medicines only as told by your health care provider. Follow directions carefully. Blood  pressure medicines must be taken as prescribed. Do not skip doses of blood pressure medicine. Doing this puts you at risk for problems and can make the medicine less effective. Ask your health care provider about side effects or reactions to medicines that you should watch for. Contact a health care provider if you: Think you are having a reaction to a medicine you are taking. Have headaches that keep coming back (recurring). Feel dizzy. Have swelling in your ankles. Have trouble with your vision. Get help right away if you: Develop a severe headache or confusion. Have unusual weakness or numbness. Feel faint. Have severe pain in your chest or abdomen. Vomit repeatedly. Have trouble breathing. These symptoms may be an emergency. Get help right away. Call 911. Do not wait to see if the symptoms will go away. Do not drive yourself to the hospital. Summary Hypertension is when the force of blood pumping through your arteries is too strong. If this condition is not controlled, it may put you at risk for serious complications. Your personal target blood pressure may vary depending on your medical conditions, your age, and other factors. For most people, a normal blood pressure is less than 120/80. Hypertension is treated with lifestyle changes, medicines, or a combination of both. Lifestyle changes include losing weight, eating a healthy,   low-sodium diet, exercising more, and limiting alcohol. This information is not intended to replace advice given to you by your health care provider. Make sure you discuss any questions you have with your health care provider. Document Revised: 11/23/2020 Document Reviewed: 11/23/2020 Elsevier Patient Education  2024 [employer]  

## 2022-11-23 NOTE — Progress Notes (Signed)
Subjective:  Patient ID: Penny Ramirez, female    DOB: October 05, 1970  Age: 52 y.o. MRN: [id number]  CC: Hypertension and Cough   HPI Penny Ramirez presents for f/up ---  Discussed the use of AI scribe software for clinical note transcription with the patient, who gave verbal consent to proceed.  History of Present Illness   The patient, with a history of hypertension and kidney disease, presents with episodes of nocturnal panic, possibly related to sleep apnea. She reports waking up in a panic, feeling as though she may not be breathing, and has been told by her spouse that she snores. However, there have been no reports of observed apneas. During these episodes, the patient has noted elevated blood pressure readings, with one instance reaching 183/103, prompting a call to emergency services. She denies associated symptoms such as headache, blurred vision, chest pain, or shortness of breath, but describes a sensation of deep, panicked breathing.  The patient also reports ongoing anxiety, which has been particularly heightened since a recent diagnosis of kidney disease. This anxiety has led to a fear of taking medications and is hesitant to consider pharmacological treatment for her anxiety. Despite this, she denies symptoms of dizziness, lightheadedness, or edema.       Outpatient Medications Prior to Visit  Medication Sig Dispense Refill   atorvastatin (LIPITOR) 10 MG tablet Take 1 tablet (10 mg total) by mouth daily. 90 tablet 1   Blood Pressure Monitoring (OMRON 3 SERIES BP MONITOR) DEVI Use as directed to check blood pressure daily. 1 each 0   carvedilol (COREG) 12.5 MG tablet Take 1 tablet (12.5 mg total) by mouth 2 (two) times daily with a meal. 180 tablet 0   ciclopirox (LOPROX) 0.77 % cream Apply topically 2 (two) times daily. 90 g 3   fluticasone-salmeterol (ADVAIR DISKUS) 100-50 MCG/ACT AEPB Inhale 1 puff into the lungs 2 (two) times daily. 180 each 0    triamterene-hydrochlorothiazide (MAXZIDE-25) 37.5-25 MG tablet Take 1 tablet by mouth daily. 90 tablet 0   benzonatate (TESSALON) 100 MG capsule Take 1 capsule (100 mg total) by mouth 3 (three) times daily as needed for cough. 30 capsule 0   meloxicam (MOBIC) 7.5 MG tablet Take 1 tablet (7.5 mg total) by mouth daily. 90 tablet 0   NEOMYCIN-POLYMYXIN-HYDROCORTISONE (CORTISPORIN) 1 % SOLN OTIC solution Place 3 drops into the right ear every 8 (eight) hours. 10 mL 1   No facility-administered medications prior to visit.    ROS Review of Systems  Constitutional:  Negative for chills, diaphoresis, fatigue and fever.  HENT: Negative.  Negative for sinus pain, sore throat and trouble swallowing.   Respiratory:  Positive for cough. Negative for apnea, shortness of breath and wheezing.        NP cough for 3 days  Cardiovascular:  Negative for chest pain, palpitations and leg swelling.  Gastrointestinal:  Negative for abdominal pain, constipation, diarrhea, nausea and vomiting.  Genitourinary: Negative.  Negative for difficulty urinating.  Musculoskeletal: Negative.   Skin: Negative.   Neurological:  Negative for dizziness.  Hematological:  Negative for adenopathy. Does not bruise/bleed easily.  Psychiatric/Behavioral:  Positive for dysphoric mood and sleep disturbance. The patient is nervous/anxious.     Objective:  BP 138/86 (BP Location: Left Arm, Patient Position: Sitting, Cuff Size: Normal)   Pulse 81   Temp 98.2 F (36.8 C) (Oral)   Resp 16   Ht 5\' 4"  (1.626 m)   Wt 221 lb 6.4 oz (100.4 kg)  LMP 10/11/2022 (Exact Date)   SpO2 95%   BMI 38.00 kg/m   BP Readings from Last 3 Encounters:  11/23/22 138/86  11/21/22 (!) 153/116  10/25/22 (!) 148/78    Wt Readings from Last 3 Encounters:  11/23/22 221 lb 6.4 oz (100.4 kg)  10/25/22 225 lb (102.1 kg)  06/22/22 229 lb (103.9 kg)    Physical Exam Vitals reviewed.  Constitutional:      Appearance: Normal appearance.  HENT:      Mouth/Throat:     Mouth: Mucous membranes are moist.  Eyes:     General: No scleral icterus.    Conjunctiva/sclera: Conjunctivae normal.  Cardiovascular:     Rate and Rhythm: Normal rate and regular rhythm.     Heart sounds: No murmur heard. Pulmonary:     Effort: Pulmonary effort is normal.     Breath sounds: No stridor. No wheezing, rhonchi or rales.  Abdominal:     General: Abdomen is flat.     Palpations: There is no mass.     Tenderness: There is no abdominal tenderness. There is no guarding.     Hernia: No hernia is present.  Musculoskeletal:        General: Normal range of motion.     Cervical back: Neck supple.     Right lower leg: No edema.     Left lower leg: No edema.  Lymphadenopathy:     Cervical: No cervical adenopathy.  Skin:    General: Skin is warm and dry.  Neurological:     General: No focal deficit present.     Mental Status: She is alert. Mental status is at baseline.  Psychiatric:        Mood and Affect: Mood normal.        Behavior: Behavior normal.     Lab Results  Component Value Date   WBC 8.3 06/22/2022   HGB 12.6 06/22/2022   HCT 38.7 06/22/2022   PLT 358.0 06/22/2022   GLUCOSE 105 (H) 10/25/2022   CHOL 218 (H) 10/25/2022   TRIG 87.0 10/25/2022   HDL 47.60 10/25/2022   LDLCALC 153 (H) 10/25/2022   ALT 15 10/25/2022   AST 16 10/25/2022   NA 140 10/25/2022   K 3.9 10/25/2022   CL 102 10/25/2022   CREATININE 1.04 10/25/2022   BUN 11 10/25/2022   CO2 30 10/25/2022   TSH 1.30 10/25/2022   HGBA1C 6.2 10/25/2022    DG Chest 2 View  Result Date: 11/23/2022 CLINICAL DATA:  Acute cough.  Cough for 2 days. EXAM: CHEST - 2 VIEW COMPARISON:  08/11/2021 FINDINGS: The cardiomediastinal contours are normal. Tiny focus of scarring at the right lung base. Pulmonary vasculature is normal. No consolidation, pleural effusion, or pneumothorax. No acute osseous abnormalities are seen. IMPRESSION: No acute findings or explanation for cough.  Electronically Signed   By: Narda Rutherford M.D.   On: 11/23/2022 15:47     Assessment & Plan:  Loud snoring -     Ambulatory referral to Sleep Studies  Acute cough- CXR is negative for mass/infiltrate. -     DG Chest 2 View; Future  Essential hypertension, benign - Her BP is well controlled.  Viral URI with cough -     HYDROcodone Bit-Homatrop MBr; Take 5 mLs by mouth every 8 (eight) hours as needed for up to 7 days for cough.  Dispense: 105 mL; Refill: 0     Follow-up: Return in about 4 months (around 03/26/2023).  Sanda Linger,  MD

## 2022-11-29 ENCOUNTER — Ambulatory Visit: Payer: 59 | Admitting: Internal Medicine

## 2022-12-06 ENCOUNTER — Other Ambulatory Visit (HOSPITAL_COMMUNITY): Payer: Self-pay

## 2022-12-25 ENCOUNTER — Ambulatory Visit (INDEPENDENT_AMBULATORY_CARE_PROVIDER_SITE_OTHER): Payer: 59 | Admitting: Neurology

## 2022-12-25 ENCOUNTER — Encounter: Payer: Self-pay | Admitting: Neurology

## 2022-12-25 VITALS — BP 142/97 | HR 84 | Ht 65.0 in | Wt 225.0 lb

## 2022-12-25 DIAGNOSIS — R002 Palpitations: Secondary | ICD-10-CM | POA: Diagnosis not present

## 2022-12-25 DIAGNOSIS — Z9189 Other specified personal risk factors, not elsewhere classified: Secondary | ICD-10-CM

## 2022-12-25 DIAGNOSIS — E669 Obesity, unspecified: Secondary | ICD-10-CM | POA: Diagnosis not present

## 2022-12-25 DIAGNOSIS — R0689 Other abnormalities of breathing: Secondary | ICD-10-CM | POA: Diagnosis not present

## 2022-12-25 DIAGNOSIS — R0683 Snoring: Secondary | ICD-10-CM | POA: Diagnosis not present

## 2022-12-25 NOTE — Progress Notes (Signed)
Subjective:    Patient ID: Penny Ramirez is a 53 y.o. female.  HPI    Huston Foley, MD, PhD [facility] [address], [address] [address] [place], [place] [address]  Dear Dr. Yetta Barre,  I saw your patient, Penny Ramirez, upon your kind request in my sleep clinic today for initial consultation of her sleep disorder, in particular, concern for underlying obstructive sleep apnea.  The patient is unaccompanied today.  As you know, Penny Ramirez is a 52 year old female with an underlying medical history of hypertension, hyperlipidemia, and obesity, who reports snoring and excessive daytime somnolence as well as waking up with a startle or panic.  She has had elevated blood pressure values and also palpitations at night.  Her Epworth sleepiness score is 3 out of 24, fatigue severity score is 9 out of 63.  She lives with her husband and her 53 year old daughter.  She has 2 older children.  She has a TV in her bedroom but it is typically not on at night.  She is not aware of any family history of sleep apnea.  She works from home for [employer].  Bedtime is generally around 10 and rise time around 6:10 AM.  She has no nightly nocturia and denies recurrent nocturnal or morning headaches.  She drinks limited caffeine, usually 1 cup of coffee in the morning, she is a non-smoker and does not utilize alcohol.  She is typically a side sleeper.  They have no pets in the household.  Weight has been more or less stable. I reviewed your office note from 11/23/2022.  Her Past Medical History Is Significant For: Past Medical History:  Diagnosis Date   AMA (advanced maternal age) multigravida 35+    High cholesterol    Hx of varicella    Hypertension    labetolol    Her Past Surgical History Is Significant For: Past Surgical History:  Procedure Laterality Date   BREAST SURGERY  1999   reduction   CESAREAN SECTION  12/27/2011   Procedure: CESAREAN SECTION;  Surgeon: Dorien Chihuahua. Richardson Dopp, MD;   Location: [facility];  Service: Obstetrics;  Laterality: N/A;   REDUCTION MAMMAPLASTY Bilateral 1999    Her Family History Is Significant For: Family History  Problem Relation Age of Onset   Heart disease Father    Hypertension Father    Diabetes Father    Heart attack Father    Diabetes Paternal Uncle    Sleep apnea Neg Hx     Her Social History Is Significant For: Social History   Socioeconomic History   Marital status: Married    Spouse name: Not on file   Number of children: 3   Years of education: Not on file   Highest education level: Bachelor's degree (e.g., BA, AB, BS)  Occupational History   Not on file  Tobacco Use   Smoking status: Never   Smokeless tobacco: Never  Vaping Use   Vaping status: Never Used  Substance and Sexual Activity   Alcohol use: Never   Drug use: Never   Sexual activity: Yes    Partners: Male    Birth control/protection: I.U.D.  Other Topics Concern   Not on file  Social History Narrative   Lives with husband and daughter   Right handed   Caffeine: 1 cup/day   Social Determinants of Health   Financial Resource Strain: Low Risk  (06/21/2022)   Overall Financial Resource Strain (CARDIA)    Difficulty of Paying Living  Expenses: Not very hard  Food Insecurity: No Food Insecurity (06/21/2022)   Hunger Vital Sign    Worried About Running Out of Food in the Last Year: Never true    Ran Out of Food in the Last Year: Never true  Transportation Needs: No Transportation Needs (06/21/2022)   PRAPARE - Administrator, Civil Service (Medical): No    Lack of Transportation (Non-Medical): No  Physical Activity: Insufficiently Active (06/21/2022)   Exercise Vital Sign    Days of Exercise per Week: 3 days    Minutes of Exercise per Session: 30 min  Stress: No Stress Concern Present (06/21/2022)   [employer] of Occupational Health - Occupational Stress Questionnaire    Feeling of Stress : Only a little  Social Connections:  Moderately Integrated (06/21/2022)   Social Connection and Isolation Panel [NHANES]    Frequency of Communication with Friends and Family: Three times a week    Frequency of Social Gatherings with Friends and Family: Twice a week    Attends Religious Services: 1 to 4 times per year    Active Member of [employer] or Organizations: No    Attends Engineer, structural: Not on file    Marital Status: Married    Her Allergies Are:  No Known Allergies:   Her Current Medications Are:  Outpatient Encounter Medications as of 12/25/2022  Medication Sig   atorvastatin (LIPITOR) 10 MG tablet Take 1 tablet (10 mg total) by mouth daily.   Blood Pressure Monitoring (OMRON 3 SERIES BP MONITOR) DEVI Use as directed to check blood pressure daily.   carvedilol (COREG) 12.5 MG tablet Take 1 tablet (12.5 mg total) by mouth 2 (two) times daily with a meal.   ciclopirox (LOPROX) 0.77 % cream Apply topically 2 (two) times daily.   fluticasone-salmeterol (ADVAIR DISKUS) 100-50 MCG/ACT AEPB Inhale 1 puff into the lungs 2 (two) times daily.   triamterene-hydrochlorothiazide (MAXZIDE-25) 37.5-25 MG tablet Take 1 tablet by mouth daily.   No facility-administered encounter medications on file as of 12/25/2022.  :   Review of Systems:  Out of a complete 14 point review of systems, all are reviewed and negative with the exception of these symptoms as listed below:  Review of Systems  Neurological:        Patient is here alone for sleep consult. She states her husband tells her that snores. She doesn't have trouble sleeping but she states every once in awhile jump up from sleeping and her heart will race. Her husband has never heard her stop breathing while sleeping. She doesn't feel tired during the day. She denies any family history of sleep apnea. She has never had a sleep study before. ESS 3 FSS 9    Objective:  Neurological Exam  Physical Exam Physical Examination:   Vitals:   12/25/22 1330  BP: (!)  142/97  Pulse: 84    General Examination: The patient is a very pleasant 52 y.o. female in no acute distress. She appears well-developed and well-nourished and well groomed.   HEENT: Normocephalic, atraumatic, pupils are equal, round and reactive to light, extraocular tracking is good without limitation to gaze excursion or nystagmus noted. Hearing is grossly intact. Face is symmetric with normal facial animation. Speech is clear with no dysarthria noted. There is no hypophonia. There is no lip, neck/head, jaw or voice tremor. Neck is supple with full range of passive and active motion. There are no carotid bruits on auscultation. Oropharynx exam reveals: mild mouth  dryness, good dental hygiene and mild airway crowding, due to small airway entry, slightly elongated uvula, tonsillar size of about 1+ bilaterally, Mallampati class II.  Minimal overbite noted.  Tongue protrudes centrally and palate elevates symmetrically, neck circumference 14-1/2 inches.  Chest: Clear to auscultation without wheezing, rhonchi or crackles noted.  Heart: S1+S2+0, regular and normal without murmurs, rubs or gallops noted.   Abdomen: Soft, non-tender and non-distended.  Extremities: There is no pitting edema in the distal lower extremities bilaterally.   Skin: Warm and dry without trophic changes noted.   Musculoskeletal: exam reveals no obvious joint deformities.   Neurologically:  Mental status: The patient is awake, alert and oriented in all 4 spheres. Her immediate and remote memory, attention, language skills and fund of knowledge are appropriate. There is no evidence of aphasia, agnosia, apraxia or anomia. Speech is clear with normal prosody and enunciation. Thought process is linear. Mood is normal and affect is normal.  Cranial nerves II - XII are as described above under HEENT exam.  Motor exam: Normal bulk, strength and tone is noted. There is no obvious action or resting tremor.  Fine motor skills and  coordination: grossly intact.  Cerebellar testing: No dysmetria or intention tremor. There is no truncal or gait ataxia.  Sensory exam: intact to light touch in the upper and lower extremities.  Gait, station and balance: She stands easily. No veering to one side is noted. No leaning to one side is noted. Posture is age-appropriate and stance is narrow based. Gait shows normal stride length and normal pace. No problems turning are noted.   Assessment and Plan:  In summary, Penny Ramirez is a very pleasant 52 y.o.-year old female with an underlying medical history of hypertension, hyperlipidemia, and obesity, whose history and physical exam are concerning for sleep disordered breathing, particularly obstructive sleep apnea (OSA). A laboratory attended sleep study is typically considered "gold standard" for evaluation of sleep disordered breathing.   I had a long chat with the patient about my findings and the diagnosis of sleep apnea, particularly OSA, its prognosis and treatment options. We talked about medical/conservative treatments, surgical interventions and non-pharmacological approaches for symptom control. I explained, in particular, the risks and ramifications of untreated moderate to severe OSA, especially with respect to developing cardiovascular disease down the road, including congestive heart failure (CHF), difficult to treat hypertension, cardiac arrhythmias (particularly A-fib), neurovascular complications including TIA, stroke and dementia. Even type 2 diabetes has, in part, been linked to untreated OSA. Symptoms of untreated OSA may include (but may not be limited to) daytime sleepiness, nocturia (i.e. frequent nighttime urination), memory problems, mood irritability and suboptimally controlled or worsening mood disorder such as depression and/or anxiety, lack of energy, lack of motivation, physical discomfort, as well as recurrent headaches, especially morning or nocturnal headaches. We  talked about the importance of maintaining a healthy lifestyle and striving for healthy weight. I recommended a sleep study at this time. I outlined the differences between a laboratory attended sleep study which is considered more comprehensive and accurate over the option of a home sleep test (HST); the latter may lead to underestimation of sleep disordered breathing in some instances and does not help with diagnosing upper airway resistance syndrome and is not accurate enough to diagnose primary central sleep apnea typically. I outlined possible surgical and non-surgical treatment options of OSA, including the use of a positive airway pressure (PAP) device (i.e. CPAP, AutoPAP/APAP or BiPAP in certain circumstances), a custom-made dental device (  aka oral appliance, which would require a referral to a specialist dentist or orthodontist typically, and is generally speaking not considered for patients with full dentures or edentulous state), upper airway surgical options, such as traditional UPPP (which is not considered a first-line treatment) or the Inspire device (hypoglossal nerve stimulator, which would involve a referral for consultation with an ENT surgeon, after careful selection, following inclusion criteria - also not first-line treatment). I explained the PAP treatment option to the patient in detail, as this is generally considered first-line treatment.  The patient indicated that she would be willing to try PAP therapy, if the need arises. I explained the importance of being compliant with PAP treatment, not only for insurance purposes but primarily to improve patient's symptoms symptoms, and for the patient's long term health benefit, including to reduce Her cardiovascular risks longer-term.    We will pick up our discussion about the next steps and treatment options after testing.  We will keep her posted as to the test results by phone call and/or MyChart messaging where possible.  We will plan to  follow-up in sleep clinic accordingly as well.  I answered all her questions today and the patient was in agreement.   I encouraged her to call with any interim questions, concerns, problems or updates or email [place] through MyChart.  Generally speaking, sleep test authorizations may take up to 2 weeks, sometimes less, sometimes longer, the patient is encouraged to get in touch with [place] if they do not hear back from the sleep lab staff directly within the next 2 weeks.  Thank you very much for allowing me to participate in the care of this nice patient. If I can be of any further assistance to you please do not hesitate to call me at [phone].  Sincerely,   Huston Foley, MD, PhD

## 2022-12-25 NOTE — Patient Instructions (Signed)

## 2023-01-02 ENCOUNTER — Telehealth: Payer: Self-pay | Admitting: Neurology

## 2023-01-02 NOTE — Telephone Encounter (Signed)
[facility] aet[employer] no auth req  [employer].

## 2023-01-09 ENCOUNTER — Other Ambulatory Visit: Payer: Self-pay | Admitting: Internal Medicine

## 2023-01-09 ENCOUNTER — Other Ambulatory Visit: Payer: Self-pay

## 2023-01-09 ENCOUNTER — Other Ambulatory Visit (HOSPITAL_COMMUNITY): Payer: Self-pay

## 2023-01-09 DIAGNOSIS — J4520 Mild intermittent asthma, uncomplicated: Secondary | ICD-10-CM

## 2023-01-09 DIAGNOSIS — I10 Essential (primary) hypertension: Secondary | ICD-10-CM

## 2023-01-09 MED ORDER — FLUTICASONE-SALMETEROL 100-50 MCG/ACT IN AEPB
1.0000 | INHALATION_SPRAY | Freq: Two times a day (BID) | RESPIRATORY_TRACT | 0 refills | Status: DC
  Filled 2023-01-09: qty 180, 90d supply, fill #0

## 2023-01-09 MED ORDER — TRIAMTERENE-HCTZ 37.5-25 MG PO TABS
1.0000 | ORAL_TABLET | Freq: Every day | ORAL | 0 refills | Status: DC
  Filled 2023-01-09: qty 90, 90d supply, fill #0

## 2023-01-09 MED ORDER — CARVEDILOL 12.5 MG PO TABS
12.5000 mg | ORAL_TABLET | Freq: Two times a day (BID) | ORAL | 0 refills | Status: DC
  Filled 2023-01-09: qty 180, 90d supply, fill #0

## 2023-01-17 NOTE — Telephone Encounter (Signed)
[facility] a[employer] no auth req   Patient is scheduled at [facility] for 01/28/23 at 8 pm.  Mailed packet to the patient and sent mychart.

## 2023-01-26 ENCOUNTER — Ambulatory Visit
Admission: RE | Admit: 2023-01-26 | Discharge: 2023-01-26 | Disposition: A | Discharge status: Home / Self Care | Payer: 59 | Source: Ambulatory Visit | Attending: Obstetrics and Gynecology | Admitting: Obstetrics and Gynecology

## 2023-01-26 DIAGNOSIS — Z1231 Encounter for screening mammogram for malignant neoplasm of breast: Secondary | ICD-10-CM

## 2023-01-28 ENCOUNTER — Ambulatory Visit (INDEPENDENT_AMBULATORY_CARE_PROVIDER_SITE_OTHER): Payer: 59 | Admitting: Neurology

## 2023-01-28 DIAGNOSIS — R002 Palpitations: Secondary | ICD-10-CM

## 2023-01-28 DIAGNOSIS — R0689 Other abnormalities of breathing: Secondary | ICD-10-CM

## 2023-01-28 DIAGNOSIS — G4720 Circadian rhythm sleep disorder, unspecified type: Secondary | ICD-10-CM

## 2023-01-28 DIAGNOSIS — E669 Obesity, unspecified: Secondary | ICD-10-CM

## 2023-01-28 DIAGNOSIS — R0683 Snoring: Secondary | ICD-10-CM | POA: Diagnosis not present

## 2023-01-28 DIAGNOSIS — R9431 Abnormal electrocardiogram [ECG] [EKG]: Secondary | ICD-10-CM

## 2023-01-28 DIAGNOSIS — Z9189 Other specified personal risk factors, not elsewhere classified: Secondary | ICD-10-CM

## 2023-02-06 ENCOUNTER — Telehealth: Payer: 59 | Admitting: Neurology

## 2023-02-13 DIAGNOSIS — Z309 Encounter for contraceptive management, unspecified: Secondary | ICD-10-CM | POA: Diagnosis not present

## 2023-02-13 DIAGNOSIS — Z01419 Encounter for gynecological examination (general) (routine) without abnormal findings: Secondary | ICD-10-CM | POA: Diagnosis not present

## 2023-03-01 DIAGNOSIS — Z3202 Encounter for pregnancy test, result negative: Secondary | ICD-10-CM | POA: Diagnosis not present

## 2023-03-01 DIAGNOSIS — Z30433 Encounter for removal and reinsertion of intrauterine contraceptive device: Secondary | ICD-10-CM | POA: Diagnosis not present

## 2023-03-05 ENCOUNTER — Ambulatory Visit: Payer: Self-pay | Admitting: Internal Medicine

## 2023-03-05 ENCOUNTER — Ambulatory Visit
Admission: EM | Admit: 2023-03-05 | Discharge: 2023-03-05 | Disposition: A | Discharge status: Home / Self Care | Payer: Commercial Managed Care - PPO | Attending: Physician Assistant | Admitting: Physician Assistant

## 2023-03-05 DIAGNOSIS — R0782 Intercostal pain: Secondary | ICD-10-CM | POA: Diagnosis not present

## 2023-03-05 DIAGNOSIS — R300 Dysuria: Secondary | ICD-10-CM | POA: Insufficient documentation

## 2023-03-05 DIAGNOSIS — I10 Essential (primary) hypertension: Secondary | ICD-10-CM | POA: Insufficient documentation

## 2023-03-05 LAB — POCT URINALYSIS DIP (MANUAL ENTRY)
Bilirubin, UA: NEGATIVE
Glucose, UA: NEGATIVE mg/dL
Ketones, POC UA: NEGATIVE mg/dL
Nitrite, UA: NEGATIVE
Protein Ur, POC: NEGATIVE mg/dL
Spec Grav, UA: 1.01 (ref 1.010–1.025)
Urobilinogen, UA: 0.2 U/dL
pH, UA: 6 (ref 5.0–8.0)

## 2023-03-05 NOTE — Discharge Instructions (Addendum)
Go to ED if symptoms worsen or fail to improve Follow up with PCP Take tylenol as needed

## 2023-03-05 NOTE — ED Provider Notes (Incomplete)
[facility]    CSN: [id number] Arrival date & time: 03/05/23  1126      History   Chief Complaint No chief complaint on file.   HPI Penny Ramirez is a 53 y.o. female.   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 Richardson Dopp, MD;  Location: [facility];  Service: Obstetrics;  Laterality: N/A;   REDUCTION MAMMAPLASTY Bilateral 1999    OB History     Gravida  3   Para  3   Term  3   Preterm  0   AB  0   Living  3      SAB  0   IAB  0   Ectopic  0   Multiple  0   Live Births  3            Home Medications    Prior to Admission medications   Medication Sig Start Date End Date Taking? Authorizing Provider  atorvastatin (LIPITOR) 10 MG tablet Take 1 tablet (10 mg total) by mouth daily. 10/26/22   Etta Grandchild, MD  Blood Pressure Monitoring (OMRON 3 SERIES BP MONITOR) DEVI Use as directed to check blood pressure daily. 04/04/21   Etta Grandchild, MD  carvedilol  (COREG) 12.5 MG tablet Take 1 tablet (12.5 mg total) by mouth 2 (two) times daily with a meal. 01/09/23   Etta Grandchild, MD  ciclopirox (LOPROX) 0.77 % cream Apply topically 2 (two) times daily. 09/21/21   Etta Grandchild, MD  fluticasone-salmeterol (ADVAIR DISKUS) 100-50 MCG/ACT AEPB Inhale 1 puff into the lungs 2 (two) times daily. 01/09/23   Etta Grandchild, MD  triamterene-hydrochlorothiazide (MAXZIDE-25) 37.5-25 MG tablet Take 1 tablet by mouth daily. 01/09/23   Etta Grandchild, MD    Family History Family History  Problem Relation Age of Onset   Heart disease Father    Hypertension Father    Diabetes Father    Heart attack Father    Diabetes Paternal Uncle    Sleep apnea Neg Hx    BRCA 1/2 Neg Hx    Breast cancer Neg Hx     Social History Social History   Tobacco Use   Smoking status: Never   Smokeless tobacco: Never  Vaping Use   Vaping status: Never Used  Substance Use Topics   Alcohol use: Never   Drug use: Never     Allergies   Patient has no known allergies.  Review of Systems Review of Systems   Physical Exam Triage Vital Signs ED Triage Vitals  Encounter Vitals Group     BP      Systolic BP Percentile      Diastolic BP Percentile      Pulse      Resp      Temp      Temp src      SpO2      Weight      Height      Head Circumference      Peak Flow      Pain Score      Pain Loc      Pain Education      Exclude from Growth Chart    No data found.  Updated Vital Signs There were no vitals taken for this visit.  Visual Acuity Right Eye Distance:   Left Eye Distance:   Bilateral Distance:    Right Eye Near:   Left Eye Near:    Bilateral Near:     Physical Exam   UC Treatments / Results  Labs (all labs ordered are listed, but only abnormal results are displayed) Labs Reviewed - No data to display  EKG   Radiology No results found.  Procedures Procedures (including critical care time)  Medications Ordered in  UC Medications - No data to display  Initial Impression / Assessment and Plan / UC Course  I have reviewed the triage vital signs and the nursing notes.  Pertinent labs & imaging results that were available during my care of the patient were reviewed by me and considered in my medical decision making (see chart for details).     *** Final Clinical Impressions(s) / UC Diagnoses   Final diagnoses:  None   Discharge Instructions   None    ED Prescriptions   None    PDMP not reviewed this encounter.

## 2023-03-05 NOTE — Telephone Encounter (Signed)
Copied from CRM [id number]. Topic: Clinical - Red Word Triage >> Mar 05, 2023 11:16 AM Lennart Pall wrote: Reason for CRM: Patients blood pressure read as 190/104 today along with congestion.  Chief Complaint: Elevated BP Symptoms: Tingling in chest Frequency: This morning Pertinent Negatives: Patient denies blurred vision, headache, difficulty breathing, weakness Disposition: [] ED /[x] Urgent Care (no appt availability in office) / [] Appointment(In office/virtual)/ []  [place] [facility]/ [] Home Care/ [] Refused Recommended Disposition /[] [place] [facility]/ []  Follow-up with PCP Additional Notes: Patient called in to report elevated BP. Patient stated that her BP measured 190/104, 30 minutes prior to this call. Patient stated that she noticed mild "tingling/burning" in her chest, but did not describe it as painful. Patient also reported back pain. Patient denied blurred vision, headache, difficulty breathing, and weakness. Patient is currently taking blood pressure medications and denied missing any recent doses. Patient was physically at an UC when she called this RN. This RN advised the patient to stay at the UC to be seen by a provider. This RN advised to seek emergency care, if recommended by the provider at the [facility]. Patient complied.   Reason for Disposition  Systolic BP  >= 180 OR Diastolic >= 110  Answer Assessment - Initial Assessment Questions 1. BLOOD PRESSURE: "What is the blood pressure?" "Did you take at least two measurements 5 minutes apart?"     190/104, taken 30 minutes ago 2. ONSET: "When did you take your blood pressure?"     High reading this morning, states she routinely checks BP every night 3. HOW: "How did you take your blood pressure?" (e.g., automatic home BP monitor, visiting nurse)     At home 4. HISTORY: "Do you have a history of high blood pressure?"     Yes 5. MEDICINES: "Are you taking any medicines for blood pressure?" "Have you missed any doses recently?"      Carvediolol 6. OTHER SYMPTOMS: "Do you have any symptoms?" (e.g., blurred vision, chest pain, difficulty breathing, headache, weakness)     Slight tingling/burning in chest, back pain, denies blurred vision, denies headache, denies difficulty breathing, denies weakness  Protocols used: Blood Pressure - High-A-AH

## 2023-03-05 NOTE — ED Triage Notes (Signed)
Pt presents to UC w/ c/o chest burning x1 week. States that today, her BP kept increasing and her last home SBP was 190.

## 2023-03-05 NOTE — ED Provider Notes (Signed)
[facility]    CSN: [id number] Arrival date & time: 03/05/23  1126      History   Chief Complaint No chief complaint on file.   HPI Penny Ramirez is a 53 y.o. female.   Patient here c/w "high blood pressure" x this morning.  Normally well controlled. She took her HTN medication last night and this morning.  Denies HA, vision changes, wheezing, SOB, LE edema, palpitations, n/v, weakness, n/t.  She reports she's had central chest pain x 1 week.  Sharp.   She also reports several days of urinary pressure. Denies frequency, urgency, hematuria.      Past Medical History:  Diagnosis Date   AMA (advanced maternal age) multigravida 35+    High cholesterol    Hx of varicella    Hypertension    labetolol    Patient Active Problem List   Diagnosis Date Noted   Loud snoring 11/23/2022   Acute cough 11/23/2022   Viral URI with cough 11/23/2022   Screening for colon cancer 10/25/2022   Acute eczematoid otitis externa, right ear 06/22/2022   Arthritis of right acromioclavicular joint 06/22/2022   Prediabetes 09/21/2021   Need for vaccination 09/21/2021   Mild intermittent asthma without complication 08/11/2021   Encounter for general adult medical examination with abnormal findings 09/20/2020   Hyperlipidemia LDL goal <130 08/09/2019   Diuretic-induced hypokalemia 12/25/2018   Vitamin D insufficiency 08/21/2017   Severe obesity (BMI >= 40) (HCC) 09/08/2013   Essential hypertension, benign 05/09/2013    Past Surgical History:  Procedure Laterality Date   BREAST SURGERY  1999   reduction   CESAREAN SECTION  12/27/2011   Procedure: CESAREAN SECTION;  Surgeon: Dorien Chihuahua. Richardson Dopp, MD;  Location: [facility];  Service: Obstetrics;  Laterality: N/A;   REDUCTION MAMMAPLASTY Bilateral 1999    OB History     Gravida  3   Para  3   Term  3   Preterm  0   AB  0   Living  3      SAB  0   IAB  0   Ectopic  0   Multiple  0   Live Births  3            Home  Medications    Prior to Admission medications   Medication Sig Start Date End Date Taking? Authorizing Provider  atorvastatin (LIPITOR) 10 MG tablet Take 1 tablet (10 mg total) by mouth daily. 10/26/22   Etta Grandchild, MD  Blood Pressure Monitoring (OMRON 3 SERIES BP MONITOR) DEVI Use as directed to check blood pressure daily. 04/04/21   Etta Grandchild, MD  carvedilol (COREG) 12.5 MG tablet Take 1 tablet (12.5 mg total) by mouth 2 (two) times daily with a meal. 01/09/23   Etta Grandchild, MD  ciclopirox (LOPROX) 0.77 % cream Apply topically 2 (two) times daily. 09/21/21   Etta Grandchild, MD  fluticasone-salmeterol (ADVAIR DISKUS) 100-50 MCG/ACT AEPB Inhale 1 puff into the lungs 2 (two) times daily. 01/09/23   Etta Grandchild, MD  triamterene-hydrochlorothiazide (MAXZIDE-25) 37.5-25 MG tablet Take 1 tablet by mouth daily. 01/09/23   Etta Grandchild, MD    Family History Family History  Problem Relation Age of Onset   Heart disease Father    Hypertension Father    Diabetes Father    Heart attack Father    Diabetes Paternal Uncle    Sleep apnea Neg Hx    BRCA 1/2 Neg  Hx    Breast cancer Neg Hx     Social History Social History   Tobacco Use   Smoking status: Never   Smokeless tobacco: Never  Vaping Use   Vaping status: Never Used  Substance Use Topics   Alcohol use: Never   Drug use: Never     Allergies   Patient has no known allergies.   Review of Systems Review of Systems  Constitutional:  Negative for chills, fatigue and fever.  Eyes:  Negative for visual disturbance.  Respiratory:  Negative for cough, shortness of breath and wheezing.   Cardiovascular:  Negative for palpitations and leg swelling.  Gastrointestinal:  Negative for abdominal pain, diarrhea, nausea and vomiting.  Genitourinary:  Positive for dysuria. Negative for frequency, hematuria, vaginal bleeding, vaginal discharge and vaginal pain.  Musculoskeletal:  Positive for arthralgias. Negative for  myalgias.  Skin:  Negative for rash.  Neurological:  Negative for light-headedness and headaches.  Hematological:  Negative for adenopathy. Does not bruise/bleed easily.  Psychiatric/Behavioral:  Negative for confusion and sleep disturbance.      Physical Exam Triage Vital Signs ED Triage Vitals  Encounter Vitals Group     BP 03/05/23 1136 (!) 181/105     Systolic BP Percentile --      Diastolic BP Percentile --      Pulse Rate 03/05/23 1136 88     Resp 03/05/23 1136 18     Temp 03/05/23 1136 98.6 F (37 C)     Temp Source 03/05/23 1136 Oral     SpO2 03/05/23 1136 98 %     Weight --      Height --      Head Circumference --      Peak Flow --      Pain Score 03/05/23 1133 0     Pain Loc --      Pain Education --      Exclude from Growth Chart --    No data found.  Updated Vital Signs BP (!) 166/101 (BP Location: Left Arm)   Pulse 88   Temp 98.6 F (37 C) (Oral)   Resp 18   SpO2 98%   Visual Acuity Right Eye Distance:   Left Eye Distance:   Bilateral Distance:    Right Eye Near:   Left Eye Near:    Bilateral Near:     Physical Exam Vitals and nursing note reviewed.  Constitutional:      General: She is not in acute distress.    Appearance: Normal appearance. She is not ill-appearing.  HENT:     Head: Normocephalic and atraumatic.  Eyes:     General: No scleral icterus.    Extraocular Movements: Extraocular movements intact.     Conjunctiva/sclera: Conjunctivae normal.  Cardiovascular:     Rate and Rhythm: Normal rate and regular rhythm.     Heart sounds: No murmur heard. Pulmonary:     Effort: Pulmonary effort is normal. No respiratory distress.     Breath sounds: Normal breath sounds. No wheezing or rales.  Chest:     Chest wall: Tenderness present. No mass, deformity or swelling.    Abdominal:     General: There is no distension.     Tenderness: There is no abdominal tenderness. There is no right CVA tenderness, left CVA tenderness, guarding or  rebound.  Musculoskeletal:     Cervical back: Normal range of motion. No rigidity.  Lymphadenopathy:     Cervical: No cervical adenopathy.  Skin:  Coloration: Skin is not jaundiced.     Findings: No rash.  Neurological:     General: No focal deficit present.     Mental Status: She is alert and oriented to person, place, and time.     Cranial Nerves: No cranial nerve deficit.     Motor: No weakness.     Gait: Gait normal.  Psychiatric:        Mood and Affect: Mood normal.        Behavior: Behavior normal.      UC Treatments / Results  Labs (all labs ordered are listed, but only abnormal results are displayed) Labs Reviewed  POCT URINALYSIS DIP (MANUAL ENTRY) - Abnormal; Notable for the following components:      Result Value   Blood, UA trace-intact (*)    Leukocytes, UA Trace (*)    All other components within normal limits  URINE CULTURE    EKG   Radiology No results found.  Procedures Procedures (including critical care time)  Medications Ordered in UC Medications - No data to display  Initial Impression / Assessment and Plan / UC Course  I have reviewed the triage vital signs and the nursing notes.  Pertinent labs & imaging results that were available during my care of the patient were reviewed by me and considered in my medical decision making (see chart for details).     UA with trace blood and leuk ECG normal, no ST changes - compared to ECG on 10/25/22 and no changes noted Chest pain likely costochondritis, unlikely to be cardiac related No sign of end organ damage with HTN - follow up with PCP ED precautions provided Final Clinical Impressions(s) / UC Diagnoses   Final diagnoses:  Dysuria  Hypertension, unspecified type  Intercostal pain     Discharge Instructions      Go to ED if symptoms worsen or fail to improve Follow up with PCP Take tylenol as needed    ED Prescriptions   None    PDMP not reviewed this encounter.    Evern Core, PA-C 03/05/23 1505

## 2023-03-07 ENCOUNTER — Encounter: Payer: Self-pay | Admitting: Internal Medicine

## 2023-03-07 ENCOUNTER — Ambulatory Visit: Payer: Commercial Managed Care - PPO | Admitting: Internal Medicine

## 2023-03-07 ENCOUNTER — Ambulatory Visit (INDEPENDENT_AMBULATORY_CARE_PROVIDER_SITE_OTHER): Payer: Commercial Managed Care - PPO

## 2023-03-07 ENCOUNTER — Other Ambulatory Visit (HOSPITAL_COMMUNITY): Payer: Self-pay

## 2023-03-07 VITALS — BP 138/88 | HR 86 | Temp 98.0°F | Resp 16 | Ht 65.0 in | Wt 216.0 lb

## 2023-03-07 DIAGNOSIS — R7303 Prediabetes: Secondary | ICD-10-CM

## 2023-03-07 DIAGNOSIS — R079 Chest pain, unspecified: Secondary | ICD-10-CM | POA: Diagnosis not present

## 2023-03-07 DIAGNOSIS — R059 Cough, unspecified: Secondary | ICD-10-CM | POA: Diagnosis not present

## 2023-03-07 DIAGNOSIS — I1A0 Resistant hypertension: Secondary | ICD-10-CM | POA: Diagnosis not present

## 2023-03-07 DIAGNOSIS — I10 Essential (primary) hypertension: Secondary | ICD-10-CM

## 2023-03-07 DIAGNOSIS — R051 Acute cough: Secondary | ICD-10-CM

## 2023-03-07 DIAGNOSIS — F411 Generalized anxiety disorder: Secondary | ICD-10-CM

## 2023-03-07 DIAGNOSIS — E876 Hypokalemia: Secondary | ICD-10-CM | POA: Diagnosis not present

## 2023-03-07 DIAGNOSIS — E785 Hyperlipidemia, unspecified: Secondary | ICD-10-CM | POA: Diagnosis not present

## 2023-03-07 DIAGNOSIS — T502X5A Adverse effect of carbonic-anhydrase inhibitors, benzothiadiazides and other diuretics, initial encounter: Secondary | ICD-10-CM | POA: Diagnosis not present

## 2023-03-07 LAB — BASIC METABOLIC PANEL
BUN: 9 mg/dL (ref 6–23)
CO2: 29 meq/L (ref 19–32)
Calcium: 9.6 mg/dL (ref 8.4–10.5)
Chloride: 98 meq/L (ref 96–112)
Creatinine, Ser: 0.98 mg/dL (ref 0.40–1.20)
GFR: 67.2 mL/min (ref >60.00)
Glucose, Bld: 96 mg/dL (ref 70–99)
Potassium: 3.3 meq/L — ABNORMAL LOW (ref 3.5–5.1)
Sodium: 136 meq/L (ref 135–145)

## 2023-03-07 LAB — URINE CULTURE
Culture: NO GROWTH
Special Requests: NORMAL

## 2023-03-07 LAB — CBC WITH DIFFERENTIAL/PLATELET
Basophils Absolute: 0 10*3/uL (ref 0.0–0.1)
Basophils Relative: 0.6 % (ref 0.0–3.0)
Eosinophils Absolute: 0.4 10*3/uL (ref 0.0–0.7)
Eosinophils Relative: 5 % (ref 0.0–5.0)
HCT: 42.6 % (ref 36.0–46.0)
Hemoglobin: 14.2 g/dL (ref 12.0–15.0)
Lymphocytes Relative: 28.3 % (ref 12.0–46.0)
Lymphs Abs: 2 10*3/uL (ref 0.7–4.0)
MCHC: 33.2 g/dL (ref 30.0–36.0)
MCV: 90.3 fL (ref 78.0–100.0)
Monocytes Absolute: 0.6 10*3/uL (ref 0.1–1.0)
Monocytes Relative: 7.8 % (ref 3.0–12.0)
Neutro Abs: 4.1 10*3/uL (ref 1.4–7.7)
Neutrophils Relative %: 58.3 % (ref 43.0–77.0)
Platelets: 381 10*3/uL (ref 150.0–400.0)
RBC: 4.72 Mil/uL (ref 3.87–5.11)
RDW: 13.4 % (ref 11.5–15.5)
WBC: 7.1 10*3/uL (ref 4.0–10.5)

## 2023-03-07 LAB — URINALYSIS, ROUTINE W REFLEX MICROSCOPIC
Bilirubin Urine: NEGATIVE
Ketones, ur: NEGATIVE
Nitrite: NEGATIVE
Specific Gravity, Urine: <=1.005 — AB (ref 1.000–1.030)
Total Protein, Urine: NEGATIVE
Urine Glucose: NEGATIVE
Urobilinogen, UA: 0.2 (ref 0.0–1.0)
pH: 6.5 (ref 5.0–8.0)

## 2023-03-07 LAB — POCT INFLUENZA A/B
Influenza A, POC: NEGATIVE
Influenza B, POC: NEGATIVE

## 2023-03-07 LAB — HEMOGLOBIN A1C: Hgb A1c MFr Bld: 6.1 % (ref 4.6–6.5)

## 2023-03-07 LAB — POC COVID19 BINAXNOW: SARS Coronavirus 2 Ag: NEGATIVE

## 2023-03-07 MED ORDER — SPIRONOLACTONE 50 MG PO TABS
50.0000 mg | ORAL_TABLET | Freq: Every day | ORAL | 0 refills | Status: DC
  Filled 2023-03-07: qty 90, 90d supply, fill #0

## 2023-03-07 MED ORDER — ESCITALOPRAM OXALATE 5 MG PO TABS
5.0000 mg | ORAL_TABLET | Freq: Every day | ORAL | 0 refills | Status: DC
  Filled 2023-03-07: qty 30, 30d supply, fill #0

## 2023-03-07 NOTE — Patient Instructions (Signed)

## 2023-03-07 NOTE — Progress Notes (Signed)
 Subjective:  Patient ID: Penny Ramirez, female    DOB: 06-10-1970  Age: 53 y.o. MRN: [id number]  CC: Cough and Hypertension   HPI Penny Ramirez presents for f/up ---  Discussed the use of AI scribe software for clinical note transcription with the patient, who gave verbal consent to proceed.  History of Present Illness   Penny Ramirez is a 53 year old female who presents with anxiety and chest pain.  She experiences persistent anxiety, describing a constant feeling of something being wrong. She recalls a previous visit in October when she declined medication for anxiety but now feels she may need it.  She has been experiencing chest pain for about a week, describing it as an ache that occasionally turns into a pinch. The pain is located in the chest and sometimes radiates to the back. She visited urgent care where her blood pressure was noted to be high, and an EKG was performed, which showed no concerning findings.   She mentions an ache in her side, expressing concern about her kidney health. She had a urine dip test at urgent care, which showed a trace of white blood cells and blood. Her last menstrual cycle was two weeks ago, and she had an IUD placed last week, which she feels might be related to her symptoms. No painful urination or visible blood in her urine, noting her urine is clear. She experiences a sensation of pressure, particularly when exercising, but it is not painful.  She reports a cough that is not constant, likening it to a reaction to cold air exposure. No wheezing and she uses Advair  Discus occasionally when the cough becomes bothersome, though she does not feel short of breath.       Outpatient Medications Prior to Visit  Medication Sig Dispense Refill   Blood Pressure Monitoring (OMRON 3 SERIES BP MONITOR) DEVI Use as directed to check blood pressure daily. 1 each 0   carvedilol  (COREG ) 12.5 MG tablet Take 1 tablet (12.5 mg total) by mouth 2 (two) times daily  with a meal. 180 tablet 0   ciclopirox  (LOPROX ) 0.77 % cream Apply topically 2 (two) times daily. 90 g 3   fluticasone -salmeterol (ADVAIR  DISKUS) 100-50 MCG/ACT AEPB Inhale 1 puff into the lungs 2 (two) times daily. 180 each 0   atorvastatin  (LIPITOR) 10 MG tablet Take 1 tablet (10 mg total) by mouth daily. 90 tablet 1   triamterene -hydrochlorothiazide  (MAXZIDE -25) 37.5-25 MG tablet Take 1 tablet by mouth daily. 90 tablet 0   No facility-administered medications prior to visit.    ROS Review of Systems  Constitutional: Negative.   HENT: Negative.  Negative for sore throat and trouble swallowing.   Eyes: Negative.   Respiratory: Negative.  Negative for cough, chest tightness, shortness of breath, wheezing and stridor.   Cardiovascular:  Positive for chest pain. Negative for palpitations and leg swelling.  Gastrointestinal:  Negative for abdominal pain, constipation, diarrhea, nausea and vomiting.  Genitourinary:  Positive for flank pain. Negative for difficulty urinating, dysuria and hematuria.  Skin: Negative.   Neurological: Negative.  Negative for dizziness and weakness.  Hematological:  Negative for adenopathy. Does not bruise/bleed easily.  Psychiatric/Behavioral:  Negative for confusion, decreased concentration, dysphoric mood, sleep disturbance and suicidal ideas. The patient is nervous/anxious.     Objective:  BP 138/88 (BP Location: Right Arm, Cuff Size: Normal)   Pulse 86   Temp 98 F (36.7 C) (Temporal)   Resp 16   Ht 5' 5 (  1.651 m)   Wt 216 lb (98 kg)   LMP 02/26/2023 (Approximate)   SpO2 97%   BMI 35.94 kg/m   BP Readings from Last 3 Encounters:  03/07/23 138/88  03/05/23 (!) 166/101  12/25/22 (!) 142/97    Wt Readings from Last 3 Encounters:  03/07/23 216 lb (98 kg)  12/25/22 225 lb (102.1 kg)  11/23/22 221 lb 6.4 oz (100.4 kg)    Physical Exam Vitals reviewed.  Constitutional:      Appearance: Normal appearance. She is not ill-appearing.  HENT:      Mouth/Throat:     Mouth: Mucous membranes are moist.  Eyes:     General: No scleral icterus.    Conjunctiva/sclera: Conjunctivae normal.  Cardiovascular:     Rate and Rhythm: Normal rate and regular rhythm.     Heart sounds: No murmur heard.    No gallop.  Pulmonary:     Effort: Pulmonary effort is normal.     Breath sounds: No stridor. No wheezing, rhonchi or rales.  Abdominal:     General: Abdomen is flat.     Palpations: There is no mass.     Tenderness: There is no abdominal tenderness. There is no guarding.     Hernia: No hernia is present.  Musculoskeletal:        General: Normal range of motion.     Cervical back: Neck supple.     Right lower leg: No edema.     Left lower leg: No edema.  Lymphadenopathy:     Cervical: No cervical adenopathy.  Skin:    General: Skin is warm and dry.  Neurological:     General: No focal deficit present.     Mental Status: She is alert. Mental status is at baseline.  Psychiatric:        Attention and Perception: Attention and perception normal. She is attentive. She does not perceive visual hallucinations.        Mood and Affect: Affect normal. Mood is anxious.        Behavior: Behavior normal.        Thought Content: Thought content normal. Thought content is not paranoid or delusional. Thought content does not include homicidal or suicidal ideation.        Cognition and Memory: Cognition normal.        Judgment: Judgment normal.     Lab Results  Component Value Date   WBC 7.1 03/07/2023   HGB 14.2 03/07/2023   HCT 42.6 03/07/2023   PLT 381.0 03/07/2023   GLUCOSE 96 03/07/2023   CHOL 218 (H) 10/25/2022   TRIG 87.0 10/25/2022   HDL 47.60 10/25/2022   LDLCALC 153 (H) 10/25/2022   ALT 15 10/25/2022   AST 16 10/25/2022   NA 136 03/07/2023   K 3.3 (L) 03/07/2023   CL 98 03/07/2023   CREATININE 0.98 03/07/2023   BUN 9 03/07/2023   CO2 29 03/07/2023   TSH 1.30 10/25/2022   HGBA1C 6.1 03/07/2023   DG Chest 2 View Result  Date: 03/07/2023 CLINICAL DATA:  Chest pain and cough. EXAM: CHEST - 2 VIEW COMPARISON:  Chest radiograph dated 11/23/2022 FINDINGS: No focal consolidation, pleural effusion, or pneumothorax. The cardiac silhouette is within normal limits. No acute osseous pathology. IMPRESSION: No active cardiopulmonary disease. Electronically Signed   By: Vanetta Chou M.D.   On: 03/07/2023 12:16   MM 3D SCREENING MAMMOGRAM BILATERAL BREAST Result Date: 02/01/2023 CLINICAL DATA:  Screening. EXAM: DIGITAL SCREENING BILATERAL MAMMOGRAM WITH  TOMOSYNTHESIS AND CAD TECHNIQUE: Bilateral screening digital craniocaudal and mediolateral oblique mammograms were obtained. Bilateral screening digital breast tomosynthesis was performed. The images were evaluated with computer-aided detection. COMPARISON:  Previous exam(s). ACR Breast Density Category b: There are scattered areas of fibroglandular density. FINDINGS: There are no findings suspicious for malignancy. IMPRESSION: No mammographic evidence of malignancy. A result letter of this screening mammogram will be mailed directly to the patient. RECOMMENDATION: Screening mammogram in one year. (Code:SM-B-01Y) BI-RADS CATEGORY  1: Negative. Electronically Signed   By: Toribio Agreste M.D.   On: 02/01/2023 14:19   Nocturnal polysomnography Result Date: 01/28/2023 Buck Saucer, MD     02/01/2023 12:31 PM Physician Interpretation:  [facility] POLYSOMNOGRAPHY  INTERPRETATION REPORT STUDY DATE:  01/28/2023  PATIENT NAME:  Taffany Heiser        DATE OF BIRTH:  09-19-1970 PATIENT ID:  [id number]    TYPE OF STUDY:  PSG READING PHYSICIAN: Saucer Buck, MD, PhD   SCORING TECHNICIAN: Jesusa Haddock, RPSGT Referred by: Joshua Debby CROME, MD ? History and Indication for Testing: 53 year old female with an underlying medical history of hypertension, hyperlipidemia, and obesity, who reports snoring and excessive daytime somnolence as well as waking up with a startle or panic.  She has had elevated blood pressure values and also palpitations at night. Her Epworth sleepiness score is 3 out of 24, fatigue severity score is 9 out of 63.  Height: 65 in Weight: 225 lb (BMI 37) Neck Size: 15 in  MEDICATIONS: Lipitor, Coreg , Loprox , Advair  Diskus, Maxzide   TECHNICAL DESCRIPTION: A registered sleep technologist was in attendance for the duration of the recording.  Data collection, scoring, video monitoring, and reporting were performed in compliance with the AASM Manual for the Scoring of Sleep and Associated Events; (Hypopnea is scored based on the criteria listed in Section VIII D. 1b in the AASM Manual V2.6 using a 4% oxygen desaturation rule or Hypopnea is scored based on the criteria listed in Section VIII D. 1a in the AASM Manual V2.6 using 3% oxygen desaturation and /or arousal rule). SLEEP CONTINUITY AND SLEEP ARCHITECTURE:  Lights-out was at 21:34: and lights-on at  04:45:, with a total recording time of 7 hours, 11.5 min. Total sleep time ( TST) was 379.5 minutes with a normal sleep efficiency at 87.9%. There was  21.6% REM sleep. BODY POSITION:  TST was divided  between the following sleep positions: 0.0% supine;  100.0% lateral;  0% prone. Duration of total sleep and percent of total sleep in their respective position is as follows: supine 00 minutes (0%), non-supine 380 minutes (100%); right 247 minutes (65%), left 132 minutes (35%), and prone 00 minutes (0%). Total supine REM sleep time was 00 minutes (0% of total REM sleep).  Sleep latency was normal at 20.0 minutes.  REM sleep latency was slightly below normal at 67.0 minutes. Of the total sleep time, the percentage of stage N1 sleep was 7.2%, stage N2 sleep was 64%, which is increased, stage N3 sleep was 7.6%, which is reduced, and REM sleep was 21.6%, which is normal. Wake after sleep onset (WASO) time accounted for 31.5 minutes with minimal to mild sleep fragmentation noted. RESPIRATORY MONITORING:  Based on CMS criteria (using  a 4% oxygen desaturation rule for scoring hypopneas), there were 2 apneas (2 obstructive; 0 central; 0 mixed), and 7 hypopneas.  Apnea index was 0.3. Hypopnea index was 1.1. The apnea-hypopnea index was 1.4 overall (0.0 supine, 7 non-supine; 6.6 REM, 0.0 supine  REM).  There were 0 respiratory effort-related arousals (RERAs).  The RERA index was 0 events/h. Total respiratory disturbance index (RDI) was 1.4 events/h. RDI results showed: supine RDI  0.0 /h; non-supine RDI 1.4 /h; REM RDI 6.6 /h, supine REM RDI 0.0 /h. Based on AASM criteria (using a 3% oxygen desaturation and /or arousal rule for scoring hypopneas), there were 2 apneas (2 obstructive; 0 central; 0 mixed), and 25 hypopneas. Apnea index was 0.3. Hypopnea index was 4.0. The apnea-hypopnea index was 4.3 overall (0.0 supine, 17 non-supine; 16.8 REM, 0.0 supine REM).  There were 0 respiratory effort-related arousals (RERAs).  The RERA index was 0 events/h. Total respiratory disturbance index (RDI) was 4.3 events/h. RDI results showed: supine RDI  0.0 /h; non-supine RDI 4.3 /h; REM RDI 16.8 /h, supine REM RDI 0.0 /h.  OXIMETRY: Oxyhemoglobin Saturation Nadir during sleep was at  85% from a mean of 95%.  Of the Total sleep time (TST)   hypoxemia (=<88%) was present for  0.5 minutes, or 0.1% of total sleep time. LIMB MOVEMENTS: There were 0 periodic limb movements of sleep (0.0/hr), of which 0 (0.0/hr) were associated with an arousal.  AROUSAL: There were 64 arousals in total, for an arousal index of 10 arousals/hour.  Of these, 2 were identified as respiratory-related arousals (0 /h), 0 were PLM-related arousals (0 /h), and 74 were non-specific arousals (12 /h).  EEG: Review of the EEG showed no abnormal electrical discharges and symmetrical bihemispheric findings.  EKG: The EKG revealed normal sinus rhythm (NSR) with rare PVCs noted. The average heart rate during sleep was 72 bpm. AUDIO/VIDEO REVIEW: The audio and video review did not show any abnormal or  unusual behaviors, movements, phonations or vocalizations. The patient took no restroom breaks. Snoring was noted intermittently, in the mild to moderate range. POST-STUDY QUESTIONNAIRE: Post study, the patient indicated, that sleep was the same as usual. IMPRESSION: 1. Primary Snoring 2. Dysfunctions associated with sleep stages or arousal from sleep 3. Non-specific abnormal electrocardiogram (EKG) RECOMMENDATIONS: 1. This study does not demonstrate any significant obstructive or central sleep disordered breathing with an AHI of less than 5/hour - Her AHI was 4.3/hour - and oxygen desaturation nadir was 85% for the night. Mild to moderate, intermittent snoring was noted. Treatment with a positive airway pressure device, such as CPAP or autoPAP is not indicated, based on this test; the (near) absence of supine sleep may have led to an underestimation of her sleep disordered breathing. Weight loss and avoidance of the supine sleep position may aid in reducing her snoring. 2. This study shows some sleep fragmentation and abnormal sleep stage percentages; these are nonspecific findings and per se do not signify an intrinsic sleep disorder or a cause for the patient's sleep-related symptoms. Causes include (but are not limited to) the first night effect of the sleep study, circadian rhythm disturbances, medication effect or an underlying mood disorder or medical problem. 3. The study showed rare PVCs (premature ventricular contractions) on single lead EKG; clinical correlation is recommended and consultation with cardiology may be feasible, especially, in light of her symptom of palpitations. 4. The patient should be cautioned not to drive, work at heights, or operate dangerous or heavy equipment when tired or sleepy. Review and reiteration of good sleep hygiene measures should be pursued with any patient. 5. The patient will be advised to follow up with the referring provider, who will be notified of the test results.   I certify that I have reviewed the  entire raw data recording prior to the issuance of this report in accordance with the Standards of Accreditation of the American Academy of Sleep Medicine (AASM). True Mar, MD, PhD Medical Director, [facility]) Diplomat, ABPN (Neurology and Sleep)    Technical Report: General Information Name: Joliene, Salvador BMI: 62.55 Physician: True Mar, MD ID: [id number] Height: 65.0 in Technician: Jesusa Haddock, RPSGT Sex: Female Weight: 225.0 lb Record: [id number] Age: 78 [20-Jan-1971] Date: 01/28/2023   Medical & Medication History   53 year old female with an underlying medical history of hypertension, hyperlipidemia, and obesity, who reports snoring and excessive daytime somnolence as well as waking up with a startle or panic. She has had elevated blood pressure values and also palpitations at night. Lipitor, Coreg , Loprox , Advair  Diskus, Maxzide   Sleep Disorder    Comments  The patient came into the sleep lab for a PSG. The patient took Melatonin prior to start of study. No restroom breaks. EKG kept in NSR with occasional PVC's. Mild to moderate snoring. Respiratory events scored with a 3% desat. Majority of respiratory events in REM. The patient slept mostly lateral. All sleep stages witnessed. Some leg movements witnessed. AHI was 2.8 after 2 hrs of TST.   Lights out: 09:34:30 PM Lights on: 04:45:33 AM Time Total Supine Side Prone Upright Recording (TRT) 7h 11.69m 0h 1.3m 7h 10.30m 0h 0.74m 0h 0.94m Sleep (TST) 6h 20.17m 0h 0.20m 6h 20.5m 0h 0.81m 0h 0.92m Latency N1 N2 N3 REM Onset Per. Slp. Eff. Actual 0h 0.34m 0h 0.52m 0h 28.52m 1h 7.59m 0h 20.22m 0h 21.49m 88.06% Stg Dur Wake N1 N2 N3 REM Total 51.5 27.5 241.0 29.0 82.0 Supine 1.0 0.0 0.0 0.0 0.0 Side 50.5 27.5 241.0 29.0 82.0 Prone 0.0 0.0 0.0 0.0 0.0 Upright 0.0 0.0 0.0 0.0 0.0  Stg % Wake N1 N2 N3 REM Total 11.9 7.2 63.4 7.6 21.6 Supine 0.2 0.0 0.0 0.0 0.0 Side 11.7 7.2 63.4 7.6 21.6 Prone 0.0  0.0 0.0 0.0 0.0 Upright 0.0 0.0 0.0 0.0 0.0  Apnea Summary Sub Supine Side Prone Upright Total 2 Total 2 0 2 0 0   REM 2 0 2 0 0   NREM 0 0 0 0 0 Obs 2 REM 2 0 2 0 0   NREM 0 0 0 0 0 Mix 0 REM 0 0 0 0 0   NREM 0 0 0 0 0 Cen 0 REM 0 0 0 0 0   NREM 0 0 0 0 0 Rera Summary Sub Supine Side Prone Upright Total 0 Total 0 0 0 0 0   REM 0 0 0 0 0   NREM 0 0 0 0 0  Hypopnea Summary Sub Supine Side Prone Upright Total 25 Total 25 0 25 0 0   REM 21 0 21 0 0   NREM 4 0 4 0 0 4% Hypopnea Summary Sub Supine Side Prone Upright Total (4%) 7 Total 7 0 7 0 0   REM 7 0 7 0 0   NREM 0 0 0 0 0  AHI Total Obs Mix Cen 4.26 Apnea 0.32 0.32 0.00 0.00  Hypopnea 3.95 -- -- -- 1.42 Hypopnea (4%) 1.11 -- -- --  Total Supine Side Prone Upright Position AHI 4.26 0.00 4.26 0.00 0.00 REM AHI 16.83  NREM AHI 0.81  Position RDI 4.26 0.00 4.26 0.00 0.00 REM RDI 16.83  NREM RDI 0.81  4% Hypopnea Total Supine Side Prone Upright Position AHI (4%) 1.42 0.00 1.42  0.00 0.00 REM AHI (4%) 6.59  NREM AHI (4%) 0.00  Position RDI (4%) 1.42 0.00 1.42 0.00 0.00 REM RDI (4%) 6.59  NREM RDI (4%) 0.00  Desaturation Information Threshold: 2% <100% <90% <80% <70% <60% <50% <40% Supine 0.0 0.0 0.0 0.0 0.0 0.0 0.0 Side 127.0 4.0 0.0 0.0 0.0 0.0 0.0 Prone 0.0 0.0 0.0 0.0 0.0 0.0 0.0 Upright 0.0 0.0 0.0 0.0 0.0 0.0 0.0 Total 127.0 4.0 0.0 0.0 0.0 0.0 0.0 Index 19.0 0.6 0.0 0.0 0.0 0.0 0.0 Threshold: 3% <100% <90% <80% <70% <60% <50% <40% Supine 0.0 0.0 0.0 0.0 0.0 0.0 0.0 Side 33.0 4.0 0.0 0.0 0.0 0.0 0.0 Prone 0.0 0.0 0.0 0.0 0.0 0.0 0.0 Upright 0.0 0.0 0.0 0.0 0.0 0.0 0.0 Total 33.0 4.0 0.0 0.0 0.0 0.0 0.0 Index 4.9 0.6 0.0 0.0 0.0 0.0 0.0 Threshold: 4% <100% <90% <80% <70% <60% <50% <40% Supine 0.0 0.0 0.0 0.0 0.0 0.0 0.0 Side 13.0 4.0 0.0 0.0 0.0 0.0 0.0 Prone 0.0 0.0 0.0 0.0 0.0 0.0 0.0 Upright 0.0 0.0 0.0 0.0 0.0 0.0 0.0 Total 13.0 4.0 0.0 0.0 0.0 0.0 0.0 Index 1.9 0.6 0.0 0.0 0.0 0.0 0.0 Threshold: 3% <100% <90% <80% <70% <60% <50% <40% Supine 0 0 0 0 0 0 0 Side 33 4  0 0 0 0 0 Prone 0 0 0 0 0 0 0 Upright 0 0 0 0 0 0 0 Total 33 4 0 0 0 0 0  Awakening/Arousal Information # of Awakenings 16 Wake after sleep onset 31.39m Wake after persistent sleep 31.46m Arousal Assoc. Arousals Index Apneas 0 0.0 Hypopneas 2 0.3 Leg Movements 0 0.0 Snore 0 0.0 PTT Arousals 0 0.0 Spontaneous 74 11.7 Total 76 12.0 Leg Movement Information PLMS LMs Index Total LMs during PLMS 0 0.0 LMs w/ Microarousals 0 0.0 LM LMs Index w/ Microarousal 0 0.0 w/ Awakening 0 0.0 w/ Resp Event 0 0.0 Spontaneous 5 0.8 Total 5 0.8  Desaturation threshold setting: 3% Minimum desaturation setting: 10 seconds SaO2 nadir: 85% The longest event was a 82 sec obstructive Hypopnea with a minimum SaO2 of 89%. The lowest SaO2 was 85% associated with a 69 sec obstructive Hypopnea. EKG Rates EKG Avg Max Min Awake 73 92 64 Asleep 72 86 64 EKG Events: N/A     Assessment & Plan:   Acute cough- CXR is normal. -     POC COVID-19 BinaxNow -     POCT Influenza A/B -     DG Chest 2 View; Future  GAD (generalized anxiety disorder) -     Escitalopram  Oxalate; Take 1 tablet (5 mg total) by mouth daily.  Dispense: 30 tablet; Refill: 0  Essential hypertension, benign -     CBC with Differential/Platelet; Future -     Urinalysis, Routine w reflex microscopic; Future -     Basic metabolic panel; Future  Prediabetes -     Hemoglobin A1c; Future -     Basic metabolic panel; Future  Diuretic-induced hypokalemia -     Spironolactone ; Take 1 tablet (50 mg total) by mouth daily.  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 Will discontinue dyazide  and start spiro. -     Spironolactone ; Take 1 tablet (50 mg total) by mouth daily.  Dispense: 90 tablet; Refill: 0  Hyperlipidemia LDL goal <130 -     Atorvastatin  Calcium ; Take 1 tablet (10 mg total) by mouth daily.  Dispense: 90 tablet; Refill: 1  Follow-up: Return in about 6 months (around 09/04/2023).  Debby Molt, MD

## 2023-03-08 ENCOUNTER — Other Ambulatory Visit: Payer: Self-pay | Admitting: Internal Medicine

## 2023-03-08 ENCOUNTER — Other Ambulatory Visit (HOSPITAL_COMMUNITY): Payer: Self-pay

## 2023-03-08 DIAGNOSIS — E785 Hyperlipidemia, unspecified: Secondary | ICD-10-CM

## 2023-03-08 MED ORDER — ATORVASTATIN CALCIUM 10 MG PO TABS
10.0000 mg | ORAL_TABLET | Freq: Every day | ORAL | 1 refills | Status: DC
  Filled 2023-03-08 – 2023-04-02 (×3): qty 90, 90d supply, fill #0
  Filled 2023-08-30: qty 90, 90d supply, fill #1

## 2023-03-11 ENCOUNTER — Encounter: Payer: Self-pay | Admitting: Internal Medicine

## 2023-03-30 ENCOUNTER — Other Ambulatory Visit (HOSPITAL_COMMUNITY): Payer: Self-pay

## 2023-03-30 ENCOUNTER — Other Ambulatory Visit: Payer: Self-pay | Admitting: Internal Medicine

## 2023-03-30 DIAGNOSIS — F411 Generalized anxiety disorder: Secondary | ICD-10-CM

## 2023-04-02 ENCOUNTER — Other Ambulatory Visit (HOSPITAL_COMMUNITY): Payer: Self-pay

## 2023-04-02 MED ORDER — ESCITALOPRAM OXALATE 5 MG PO TABS
5.0000 mg | ORAL_TABLET | Freq: Every day | ORAL | 0 refills | Status: DC
Start: 2023-04-02 — End: 2023-05-20
  Filled 2023-04-02: qty 30, 30d supply, fill #0

## 2023-04-09 DIAGNOSIS — Z30431 Encounter for routine checking of intrauterine contraceptive device: Secondary | ICD-10-CM | POA: Diagnosis not present

## 2023-04-09 DIAGNOSIS — N915 Oligomenorrhea, unspecified: Secondary | ICD-10-CM | POA: Diagnosis not present

## 2023-04-24 ENCOUNTER — Ambulatory Visit: Payer: 59 | Admitting: Internal Medicine

## 2023-05-20 ENCOUNTER — Other Ambulatory Visit: Payer: Self-pay | Admitting: Internal Medicine

## 2023-05-20 DIAGNOSIS — I1A0 Resistant hypertension: Secondary | ICD-10-CM

## 2023-05-20 DIAGNOSIS — I10 Essential (primary) hypertension: Secondary | ICD-10-CM

## 2023-05-20 DIAGNOSIS — F411 Generalized anxiety disorder: Secondary | ICD-10-CM

## 2023-05-20 DIAGNOSIS — T502X5A Adverse effect of carbonic-anhydrase inhibitors, benzothiadiazides and other diuretics, initial encounter: Secondary | ICD-10-CM

## 2023-05-25 ENCOUNTER — Telehealth: Payer: Self-pay | Admitting: Internal Medicine

## 2023-05-25 ENCOUNTER — Other Ambulatory Visit (HOSPITAL_COMMUNITY): Payer: Self-pay

## 2023-05-25 DIAGNOSIS — F411 Generalized anxiety disorder: Secondary | ICD-10-CM

## 2023-05-25 DIAGNOSIS — E876 Hypokalemia: Secondary | ICD-10-CM

## 2023-05-25 DIAGNOSIS — I1A0 Resistant hypertension: Secondary | ICD-10-CM

## 2023-05-25 DIAGNOSIS — I10 Essential (primary) hypertension: Secondary | ICD-10-CM

## 2023-05-29 ENCOUNTER — Other Ambulatory Visit (HOSPITAL_COMMUNITY): Payer: Self-pay

## 2023-05-29 MED ORDER — SPIRONOLACTONE 50 MG PO TABS
50.0000 mg | ORAL_TABLET | Freq: Every day | ORAL | 1 refills | Status: DC
Start: 2023-05-29 — End: 2023-11-29
  Filled 2023-05-29: qty 90, 90d supply, fill #0
  Filled 2023-08-30: qty 90, 90d supply, fill #1

## 2023-05-29 MED ORDER — CARVEDILOL 12.5 MG PO TABS
12.5000 mg | ORAL_TABLET | Freq: Two times a day (BID) | ORAL | 1 refills | Status: DC
  Filled 2023-05-29: qty 180, 90d supply, fill #0
  Filled 2023-08-30: qty 180, 90d supply, fill #1

## 2023-05-29 MED ORDER — ESCITALOPRAM OXALATE 5 MG PO TABS
5.0000 mg | ORAL_TABLET | Freq: Every day | ORAL | 0 refills | Status: DC
  Filled 2023-05-29: qty 90, 90d supply, fill #0

## 2023-05-29 NOTE — Telephone Encounter (Signed)
 Rx sent

## 2023-05-29 NOTE — Telephone Encounter (Signed)
 Copied from CRM [id number]. Topic: Clinical - Medication Question >> May 29, 2023  8:30 AM Penny Ramirez wrote: Reason for CRM: Patient stated that she did a medication refill request online and also had the pharmacy to send a request to the office. Patient stated that the medication still hasn't been processed and its been over a week. Patient is needing the spironolactone  (ALDACTONE ) 50 MG tablet, escitalopram  (LEXAPRO ) 5 MG tablet, and the carvedilol  (COREG ) 12.5 MG tablet refilled.

## 2023-07-11 ENCOUNTER — Telehealth: Admitting: Physician Assistant

## 2023-07-11 DIAGNOSIS — J020 Streptococcal pharyngitis: Secondary | ICD-10-CM | POA: Diagnosis not present

## 2023-07-11 MED ORDER — AMOXICILLIN 500 MG PO CAPS
500.0000 mg | ORAL_CAPSULE | Freq: Two times a day (BID) | ORAL | 0 refills | Status: AC
  Filled 2023-07-11: qty 20, 10d supply, fill #0

## 2023-07-11 NOTE — Progress Notes (Signed)

## 2023-07-12 ENCOUNTER — Other Ambulatory Visit (HOSPITAL_COMMUNITY): Payer: Self-pay

## 2023-08-23 ENCOUNTER — Other Ambulatory Visit: Payer: Self-pay | Admitting: Internal Medicine

## 2023-08-23 DIAGNOSIS — F411 Generalized anxiety disorder: Secondary | ICD-10-CM

## 2023-08-23 DIAGNOSIS — J4520 Mild intermittent asthma, uncomplicated: Secondary | ICD-10-CM

## 2023-08-24 ENCOUNTER — Other Ambulatory Visit (HOSPITAL_COMMUNITY): Payer: Self-pay

## 2023-08-24 MED ORDER — ESCITALOPRAM OXALATE 5 MG PO TABS
5.0000 mg | ORAL_TABLET | Freq: Every day | ORAL | 0 refills | Status: DC
  Filled 2023-08-24: qty 90, 90d supply, fill #0

## 2023-08-24 MED ORDER — FLUTICASONE-SALMETEROL 100-50 MCG/ACT IN AEPB
1.0000 | INHALATION_SPRAY | Freq: Two times a day (BID) | RESPIRATORY_TRACT | 0 refills | Status: AC
  Filled 2023-08-24 – 2023-08-30 (×2): qty 180, 90d supply, fill #0

## 2023-08-30 ENCOUNTER — Other Ambulatory Visit: Payer: Self-pay

## 2023-08-30 ENCOUNTER — Other Ambulatory Visit (HOSPITAL_COMMUNITY): Payer: Self-pay

## 2023-09-04 ENCOUNTER — Ambulatory Visit: Payer: 59 | Admitting: Internal Medicine

## 2023-09-12 DIAGNOSIS — H5203 Hypermetropia, bilateral: Secondary | ICD-10-CM | POA: Diagnosis not present

## 2023-10-24 ENCOUNTER — Other Ambulatory Visit: Payer: Self-pay | Admitting: Obstetrics and Gynecology

## 2023-10-24 DIAGNOSIS — Z1231 Encounter for screening mammogram for malignant neoplasm of breast: Secondary | ICD-10-CM

## 2023-10-29 ENCOUNTER — Encounter: Payer: Self-pay | Admitting: Internal Medicine

## 2023-10-29 ENCOUNTER — Other Ambulatory Visit (HOSPITAL_COMMUNITY): Payer: Self-pay

## 2023-10-29 ENCOUNTER — Ambulatory Visit: Admitting: Internal Medicine

## 2023-10-29 ENCOUNTER — Ambulatory Visit: Payer: Self-pay | Admitting: Internal Medicine

## 2023-10-29 VITALS — BP 142/96 | HR 72 | Temp 98.4°F | Resp 16 | Ht 65.0 in | Wt 227.6 lb

## 2023-10-29 DIAGNOSIS — R7303 Prediabetes: Secondary | ICD-10-CM

## 2023-10-29 DIAGNOSIS — Z6837 Body mass index (BMI) 37.0-37.9, adult: Secondary | ICD-10-CM | POA: Diagnosis not present

## 2023-10-29 DIAGNOSIS — Z0000 Encounter for general adult medical examination without abnormal findings: Secondary | ICD-10-CM | POA: Diagnosis not present

## 2023-10-29 DIAGNOSIS — Z23 Encounter for immunization: Secondary | ICD-10-CM | POA: Diagnosis not present

## 2023-10-29 DIAGNOSIS — Z7985 Long-term (current) use of injectable non-insulin antidiabetic drugs: Secondary | ICD-10-CM

## 2023-10-29 DIAGNOSIS — Z0001 Encounter for general adult medical examination with abnormal findings: Secondary | ICD-10-CM

## 2023-10-29 DIAGNOSIS — T502X5A Adverse effect of carbonic-anhydrase inhibitors, benzothiadiazides and other diuretics, initial encounter: Secondary | ICD-10-CM | POA: Diagnosis not present

## 2023-10-29 DIAGNOSIS — I10 Essential (primary) hypertension: Secondary | ICD-10-CM | POA: Diagnosis not present

## 2023-10-29 DIAGNOSIS — E119 Type 2 diabetes mellitus without complications: Secondary | ICD-10-CM | POA: Diagnosis not present

## 2023-10-29 DIAGNOSIS — E785 Hyperlipidemia, unspecified: Secondary | ICD-10-CM

## 2023-10-29 DIAGNOSIS — E876 Hypokalemia: Secondary | ICD-10-CM | POA: Diagnosis not present

## 2023-10-29 LAB — LIPID PANEL
Cholesterol: 138 mg/dL (ref 0–200)
HDL: 40 mg/dL (ref >39.00)
LDL Cholesterol: 86 mg/dL (ref 0–99)
NonHDL: 98.05
Total CHOL/HDL Ratio: 3
Triglycerides: 62 mg/dL (ref 0.0–149.0)
VLDL: 12.4 mg/dL (ref 0.0–40.0)

## 2023-10-29 LAB — MAGNESIUM: Magnesium: 1.7 mg/dL (ref 1.5–2.5)

## 2023-10-29 LAB — CBC WITH DIFFERENTIAL/PLATELET
Basophils Absolute: 0.1 K/uL (ref 0.0–0.1)
Basophils Relative: 0.9 % (ref 0.0–3.0)
Eosinophils Absolute: 0.4 K/uL (ref 0.0–0.7)
Eosinophils Relative: 5.8 % — ABNORMAL HIGH (ref 0.0–5.0)
HCT: 39.5 % (ref 36.0–46.0)
Hemoglobin: 13.3 g/dL (ref 12.0–15.0)
Lymphocytes Relative: 32.9 % (ref 12.0–46.0)
Lymphs Abs: 2.3 K/uL (ref 0.7–4.0)
MCHC: 33.6 g/dL (ref 30.0–36.0)
MCV: 90.9 fl (ref 78.0–100.0)
Monocytes Absolute: 0.6 K/uL (ref 0.1–1.0)
Monocytes Relative: 8.2 % (ref 3.0–12.0)
Neutro Abs: 3.6 K/uL (ref 1.4–7.7)
Neutrophils Relative %: 52.2 % (ref 43.0–77.0)
Platelets: 280 K/uL (ref 150.0–400.0)
RBC: 4.34 Mil/uL (ref 3.87–5.11)
RDW: 13.4 % (ref 11.5–15.5)
WBC: 6.9 K/uL (ref 4.0–10.5)

## 2023-10-29 LAB — BASIC METABOLIC PANEL WITH GFR
BUN: 12 mg/dL (ref 6–23)
CO2: 28 meq/L (ref 19–32)
Calcium: 9.8 mg/dL (ref 8.4–10.5)
Chloride: 104 meq/L (ref 96–112)
Creatinine, Ser: 1.07 mg/dL (ref 0.40–1.20)
GFR: 60.2 mL/min (ref >60.00)
Glucose, Bld: 121 mg/dL — ABNORMAL HIGH (ref 70–99)
Potassium: 4.1 meq/L (ref 3.5–5.1)
Sodium: 138 meq/L (ref 135–145)

## 2023-10-29 LAB — HEMOGLOBIN A1C: Hgb A1c MFr Bld: 6.5 % (ref 4.6–6.5)

## 2023-10-29 LAB — HEPATIC FUNCTION PANEL
ALT: 18 U/L (ref 0–35)
AST: 20 U/L (ref 0–37)
Albumin: 4.3 g/dL (ref 3.5–5.2)
Alkaline Phosphatase: 75 U/L (ref 39–117)
Bilirubin, Direct: 0.1 mg/dL (ref 0.0–0.3)
Total Bilirubin: 0.5 mg/dL (ref 0.2–1.2)
Total Protein: 7.3 g/dL (ref 6.0–8.3)

## 2023-10-29 LAB — TSH: TSH: 1.99 u[IU]/mL (ref 0.35–5.50)

## 2023-10-29 MED ORDER — INSULIN PEN NEEDLE 32G X 6 MM MISC
1.0000 | 1 refills | Status: AC
  Filled 2023-10-29: qty 100, 90d supply, fill #0

## 2023-10-29 MED ORDER — ATORVASTATIN CALCIUM 10 MG PO TABS
10.0000 mg | ORAL_TABLET | Freq: Every day | ORAL | 1 refills | Status: AC
  Filled 2023-10-29 – 2023-12-07 (×2): qty 90, 90d supply, fill #0

## 2023-10-29 MED ORDER — OZEMPIC (0.25 OR 0.5 MG/DOSE) 2 MG/3ML ~~LOC~~ SOPN
0.2500 mg | PEN_INJECTOR | SUBCUTANEOUS | 0 refills | Status: DC
  Filled 2023-10-29: qty 3, 28d supply, fill #0

## 2023-10-29 NOTE — Patient Instructions (Signed)

## 2023-10-29 NOTE — Progress Notes (Signed)
 Subjective:  Patient ID: Penny Ramirez, female    DOB: [birth year]  Age: 53 y.o. MRN: [id number]  CC: Annual Exam, Hypertension, and Hyperlipidemia   HPI Penny Ramirez presents for a CPX and f/up ----  Discussed the use of AI scribe software for clinical note transcription with the patient, who gave verbal consent to proceed.  History of Present Illness Penny Ramirez is a 53 year old female who presents for vaccination updates and blood pressure monitoring.  Her blood pressure was elevated during today's visit, although it was not high when she checked it at home before leaving. She acknowledges that her blood pressure tends to rise during medical visits. She continues to take her medication twice a day.  She is unsure if she has been vaccinated against hepatitis B in the past, as she has been with the current healthcare provider for several years. She has not received a flu shot yet this season and is due for a tetanus booster.  No respiratory issues, including coughing, wheezing, or shortness of breath, and she feels fine during physical activity. She denies any stomach issues and reports that her legs do not often swell.  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     Outpatient Medications Prior to Visit  Medication Sig Dispense Refill   Blood Pressure Monitoring (OMRON 3 SERIES BP MONITOR) DEVI Use as directed to check blood pressure daily. 1 each 0   carvedilol  (COREG ) 12.5 MG tablet Take 1 tablet (12.5 mg total) by mouth 2 (two) times daily with a meal. 180 tablet 1   ciclopirox  (LOPROX ) 0.77 % cream Apply topically 2 (two) times daily. 90 g 3   escitalopram  (LEXAPRO ) 5 MG tablet Take 1 tablet (5 mg total) by mouth daily. 90 tablet 0   fluticasone -salmeterol (ADVAIR  DISKUS) 100-50 MCG/ACT AEPB Inhale 1 puff into the lungs 2 (two) times daily. 180 each 0   spironolactone  (ALDACTONE ) 50 MG tablet Take 1 tablet (50 mg total) by mouth daily. 90  tablet 1   atorvastatin  (LIPITOR) 10 MG tablet Take 1 tablet (10 mg total) by mouth daily. 90 tablet 1   No facility-administered medications prior to visit.    ROS Review of Systems  Constitutional:  Positive for unexpected weight change (wt gain). Negative for appetite change, chills, diaphoresis, fatigue and fever.  HENT: Negative.  Negative for sore throat and trouble swallowing.   Eyes: Negative.   Respiratory: Negative.  Negative for cough, chest tightness, shortness of breath and wheezing.   Cardiovascular:  Negative for chest pain, palpitations and leg swelling.  Gastrointestinal:  Negative for abdominal pain, blood in stool, diarrhea, nausea and vomiting.  Endocrine: Negative.   Genitourinary: Negative.  Negative for difficulty urinating.  Musculoskeletal: Negative.  Negative for arthralgias, joint swelling and myalgias.  Neurological:  Negative for dizziness, weakness and light-headedness.  Hematological:  Negative for adenopathy. Does not bruise/bleed easily.  Psychiatric/Behavioral: Negative.      Objective:  BP (!) 142/96 (BP Location: Left Arm, Patient Position: Sitting, Cuff Size: Normal)   Pulse 72   Temp 98.4 F (36.9 C) (Oral)   Resp 16   Ht 5' 5 (1.651 m)   Wt 227 lb 9.6 oz (103.2 kg)   SpO2 98%   BMI 37.87 kg/m   BP Readings from Last 3 Encounters:  10/29/23 (!) 142/96  03/07/23 138/88  03/05/23 (!) 166/101    Wt Readings from Last 3 Encounters:  10/29/23 227  lb 9.6 oz (103.2 kg)  03/07/23 216 lb (98 kg)  12/25/22 225 lb (102.1 kg)    Physical Exam Vitals reviewed.  Constitutional:      Appearance: Normal appearance.  HENT:     Nose: Nose normal.     Mouth/Throat:     Mouth: Mucous membranes are moist.  Eyes:     General: No scleral icterus.    Conjunctiva/sclera: Conjunctivae normal.  Cardiovascular:     Rate and Rhythm: Normal rate and regular rhythm.     Heart sounds: No murmur heard.    No friction rub. No gallop.  Pulmonary:      Effort: Pulmonary effort is normal.     Breath sounds: No stridor. No wheezing, rhonchi or rales.  Abdominal:     General: Abdomen is flat.     Palpations: There is no mass.     Tenderness: There is no abdominal tenderness. There is no guarding.     Hernia: No hernia is present.  Musculoskeletal:        General: Normal range of motion.     Cervical back: Neck supple.     Right lower leg: No edema.     Left lower leg: No edema.  Lymphadenopathy:     Cervical: No cervical adenopathy.  Skin:    General: Skin is warm and dry.  Neurological:     General: No focal deficit present.     Mental Status: She is alert.  Psychiatric:        Mood and Affect: Mood normal.        Behavior: Behavior normal.     Lab Results  Component Value Date   WBC 6.9 10/29/2023   HGB 13.3 10/29/2023   HCT 39.5 10/29/2023   PLT 280.0 10/29/2023   GLUCOSE 121 (H) 10/29/2023   CHOL 138 10/29/2023   TRIG 62.0 10/29/2023   HDL 40.00 10/29/2023   LDLCALC 86 10/29/2023   ALT 18 10/29/2023   AST 20 10/29/2023   NA 138 10/29/2023   K 4.1 10/29/2023   CL 104 10/29/2023   CREATININE 1.07 10/29/2023   BUN 12 10/29/2023   CO2 28 10/29/2023   TSH 1.99 10/29/2023   HGBA1C 6.5 10/29/2023    No results found.  Assessment & Plan:  Need for immunization against influenza -     Flu vaccine trivalent PF, 6mos and older(Flulaval,Afluria,Fluarix,Fluzone)  Essential hypertension, benign- BP is not at goal. She is working on her lifestyle modifications. -     Hepatic function panel; Future -     CBC with Differential/Platelet; Future -     TSH; Future  Severe obesity (BMI >= 40) (HCC) -     Hemoglobin A1c; Future -     TSH; Future  Hyperlipidemia LDL goal <130- LDL goal achieved. Doing well on the statin  -     Lipid panel; Future -     Hepatic function panel; Future -     TSH; Future -     Atorvastatin  Calcium ; Take 1 tablet (10 mg total) by mouth daily.  Dispense: 90 tablet; Refill: 1  Encounter for  general adult medical examination with abnormal findings- Exam completed, labs reviewed, vaccines reviewed and updated, cancer screenings are UTD, pt ed material was given.   Diuretic-induced hypokalemia -     Magnesium; Future  Immunization due -     Tdap vaccine greater than or equal to 7yo IM  Type 2 diabetes mellitus without complication, without long-term current use  of insulin  (HCC)- Will start a GLP-1 agonist. -     Ozempic (0.25 or 0.5 MG/DOSE); Inject 0.25 mg into the skin once a week.  Dispense: 3 mL; Refill: 0 -     Insulin  Pen Needle; 1 Act by Does not apply route once a week.  Dispense: 50 each; Refill: 1     Follow-up: Return in about 6 months (around 04/27/2024).  Debby Molt, MD

## 2023-10-30 ENCOUNTER — Other Ambulatory Visit (HOSPITAL_COMMUNITY): Payer: Self-pay

## 2023-11-29 ENCOUNTER — Other Ambulatory Visit: Payer: Self-pay | Admitting: Internal Medicine

## 2023-11-29 DIAGNOSIS — I10 Essential (primary) hypertension: Secondary | ICD-10-CM

## 2023-11-29 DIAGNOSIS — I1A0 Resistant hypertension: Secondary | ICD-10-CM

## 2023-11-29 DIAGNOSIS — T502X5A Adverse effect of carbonic-anhydrase inhibitors, benzothiadiazides and other diuretics, initial encounter: Secondary | ICD-10-CM

## 2023-11-29 DIAGNOSIS — F411 Generalized anxiety disorder: Secondary | ICD-10-CM

## 2023-11-29 DIAGNOSIS — E119 Type 2 diabetes mellitus without complications: Secondary | ICD-10-CM

## 2023-11-30 ENCOUNTER — Other Ambulatory Visit (HOSPITAL_COMMUNITY): Payer: Self-pay

## 2023-11-30 MED ORDER — ESCITALOPRAM OXALATE 5 MG PO TABS
5.0000 mg | ORAL_TABLET | Freq: Every day | ORAL | 0 refills | Status: AC
  Filled 2023-12-07: qty 90, 90d supply, fill #0

## 2023-11-30 MED ORDER — CARVEDILOL 12.5 MG PO TABS
12.5000 mg | ORAL_TABLET | Freq: Two times a day (BID) | ORAL | 1 refills | Status: AC
  Filled 2023-12-07: qty 180, 90d supply, fill #0

## 2023-11-30 MED ORDER — OZEMPIC (0.25 OR 0.5 MG/DOSE) 2 MG/3ML ~~LOC~~ SOPN
0.2500 mg | PEN_INJECTOR | SUBCUTANEOUS | 0 refills | Status: DC
  Filled 2023-12-07: qty 3, 56d supply, fill #0

## 2023-11-30 MED ORDER — SPIRONOLACTONE 50 MG PO TABS
50.0000 mg | ORAL_TABLET | Freq: Every day | ORAL | 1 refills | Status: AC
  Filled 2023-12-07: qty 90, 90d supply, fill #0

## 2023-12-07 ENCOUNTER — Other Ambulatory Visit (HOSPITAL_COMMUNITY): Payer: Self-pay

## 2024-01-28 ENCOUNTER — Ambulatory Visit
Admission: RE | Admit: 2024-01-28 | Discharge: 2024-01-28 | Disposition: A | Source: Ambulatory Visit | Attending: Obstetrics and Gynecology | Admitting: Obstetrics and Gynecology

## 2024-01-28 DIAGNOSIS — Z1231 Encounter for screening mammogram for malignant neoplasm of breast: Secondary | ICD-10-CM

## 2024-02-05 ENCOUNTER — Other Ambulatory Visit: Payer: Self-pay | Admitting: Internal Medicine

## 2024-02-05 DIAGNOSIS — E119 Type 2 diabetes mellitus without complications: Secondary | ICD-10-CM

## 2024-02-06 ENCOUNTER — Other Ambulatory Visit (HOSPITAL_COMMUNITY): Payer: Self-pay

## 2024-02-06 MED ORDER — OZEMPIC (0.25 OR 0.5 MG/DOSE) 2 MG/3ML ~~LOC~~ SOPN
0.2500 mg | PEN_INJECTOR | SUBCUTANEOUS | 0 refills | Status: AC
  Filled 2024-02-15: qty 6, 56d supply, fill #0

## 2024-02-15 ENCOUNTER — Other Ambulatory Visit (HOSPITAL_COMMUNITY): Payer: Self-pay

## 2024-04-28 ENCOUNTER — Ambulatory Visit: Admitting: Internal Medicine
